# Patient Record
Sex: Female | Born: 1990 | Race: White | Hispanic: No | Marital: Married
Health system: Southern US, Community
[De-identification: ages and names within clinical notes are randomized; demographics above are authoritative.]

## PROBLEM LIST (undated history)

## (undated) DIAGNOSIS — Z832 Family history of diseases of the blood and blood-forming organs and certain disorders involving the immune mechanism: Secondary | ICD-10-CM

## (undated) DIAGNOSIS — F909 Attention-deficit hyperactivity disorder, unspecified type: Secondary | ICD-10-CM

## (undated) DIAGNOSIS — O24419 Gestational diabetes mellitus in pregnancy, unspecified control: Secondary | ICD-10-CM

## (undated) DIAGNOSIS — F411 Generalized anxiety disorder: Secondary | ICD-10-CM

## (undated) DIAGNOSIS — K219 Gastro-esophageal reflux disease without esophagitis: Secondary | ICD-10-CM

## (undated) HISTORY — DX: Generalized anxiety disorder: F41.1

## (undated) HISTORY — PX: WISDOM TOOTH EXTRACTION: SHX21

## (undated) HISTORY — DX: Gestational diabetes mellitus in pregnancy, unspecified control: O24.419

---
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
## 2009-09-02 ENCOUNTER — Emergency Department (HOSPITAL_COMMUNITY): Admission: EM | Admit: 2009-09-02 | Discharge: 2009-09-02 | Payer: Self-pay | Admitting: Emergency Medicine

## 2009-09-23 ENCOUNTER — Emergency Department (HOSPITAL_COMMUNITY): Admission: EM | Admit: 2009-09-23 | Discharge: 2009-09-23 | Payer: Self-pay | Admitting: Emergency Medicine

## 2010-06-09 LAB — URINALYSIS, ROUTINE W REFLEX MICROSCOPIC
Glucose, UA: NEGATIVE mg/dL
Hgb urine dipstick: NEGATIVE
Nitrite: NEGATIVE
Urobilinogen, UA: 0.2 mg/dL (ref 0.0–1.0)

## 2011-05-26 IMAGING — CR DG CHEST 2V
2 series · 2 of 2 positions shown · non-contrast
Comparison: None.

CLINICAL DATA: Difficulty breathing.  Trouble swallowing.

CHEST - 2 VIEW

[w chest pa *]
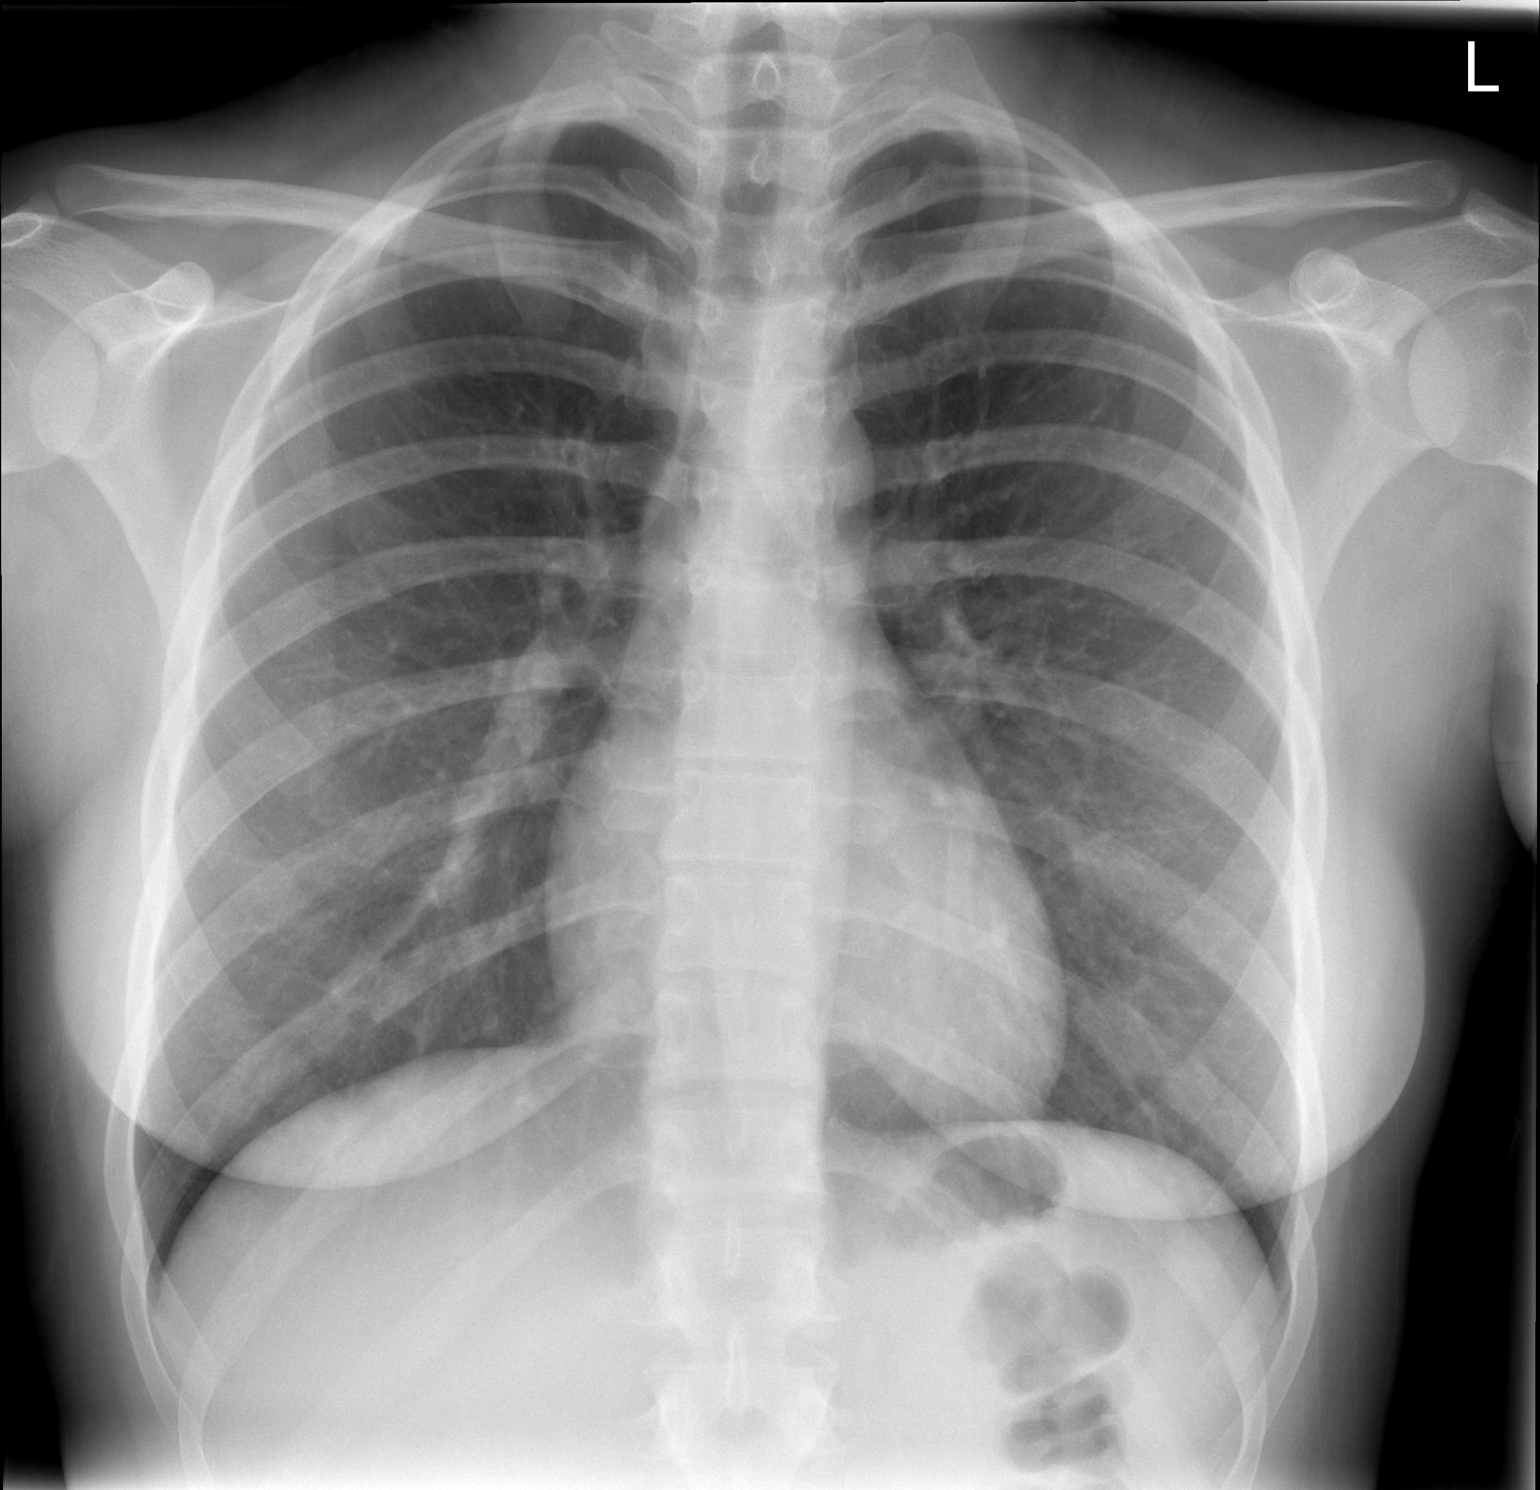

[w chest lat *]
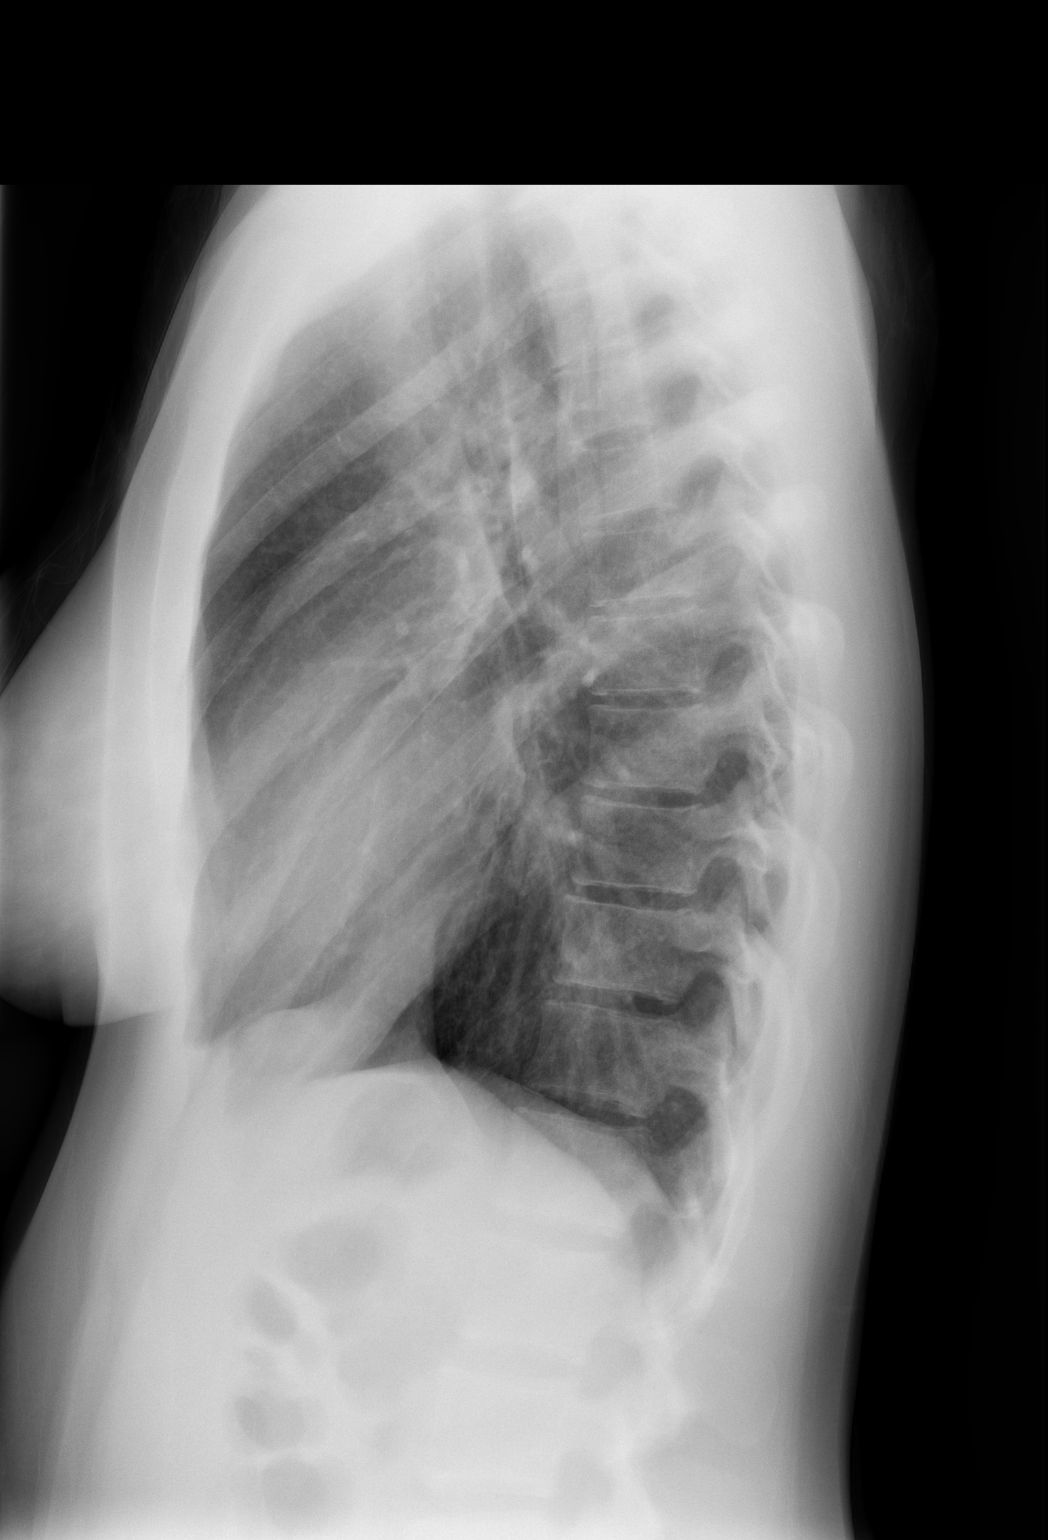

[2 of 2 positions shown; findings below may reference images not displayed]

FINDINGS: The heart size and pulmonary vascularity are normal and
the lungs are clear.  No bony abnormalities.
IMPRESSION: Normal chest.

## 2011-08-31 ENCOUNTER — Encounter (HOSPITAL_COMMUNITY): Payer: Self-pay | Admitting: Emergency Medicine

## 2011-08-31 ENCOUNTER — Emergency Department (HOSPITAL_COMMUNITY)
Admission: EM | Admit: 2011-08-31 | Discharge: 2011-08-31 | Disposition: A | Discharge status: Home / Self Care | Payer: BC Managed Care – PPO | Attending: Emergency Medicine | Admitting: Emergency Medicine

## 2011-08-31 DIAGNOSIS — R109 Unspecified abdominal pain: Secondary | ICD-10-CM | POA: Insufficient documentation

## 2011-08-31 LAB — CBC
HCT: 36 % (ref 36.0–46.0)
Hemoglobin: 12.6 g/dL (ref 12.0–15.0)
MCH: 30.9 pg (ref 26.0–34.0)
MCV: 88.2 fL (ref 78.0–100.0)
Platelets: 227 10*3/uL (ref 150–400)
RDW: 12.2 % (ref 11.5–15.5)
WBC: 10.7 10*3/uL — ABNORMAL HIGH (ref 4.0–10.5)

## 2011-08-31 LAB — COMPREHENSIVE METABOLIC PANEL
ALT: 11 U/L (ref 0–35)
Alkaline Phosphatase: 44 U/L (ref 39–117)
CO2: 19 mEq/L (ref 19–32)
Creatinine, Ser: 0.63 mg/dL (ref 0.50–1.10)
GFR calc Af Amer: >90 mL/min (ref >90)
Glucose, Bld: 81 mg/dL (ref 70–99)
Potassium: 3.6 mEq/L (ref 3.5–5.1)
Total Bilirubin: 0.3 mg/dL (ref 0.3–1.2)
Total Protein: 7 g/dL (ref 6.0–8.3)

## 2011-08-31 LAB — URINALYSIS, ROUTINE W REFLEX MICROSCOPIC
Bilirubin Urine: NEGATIVE
Glucose, UA: NEGATIVE mg/dL
Hgb urine dipstick: NEGATIVE
Ketones, ur: NEGATIVE mg/dL
Leukocytes, UA: NEGATIVE
Specific Gravity, Urine: 1.025 (ref 1.005–1.030)
Urobilinogen, UA: 1 mg/dL (ref 0.0–1.0)
pH: 7 (ref 5.0–8.0)

## 2011-08-31 LAB — DIFFERENTIAL
Basophils Relative: 0 % (ref 0–1)
Eosinophils Absolute: 0.1 10*3/uL (ref 0.0–0.7)
Eosinophils Relative: 1 % (ref 0–5)
Monocytes Absolute: 0.4 10*3/uL (ref 0.1–1.0)

## 2011-08-31 NOTE — Discharge Instructions (Signed)
As discussed please keep a diary of your discomfort, which you're doing Will you're doing at, what you've eaten how long the pain lasts how often it happens when was her last menstrual period in relation to your discomfort.  Also, try to establish yourself with a local physician for your insurance company in

## 2011-08-31 NOTE — ED Provider Notes (Addendum)
History     CSN: [id number]  Arrival date & time 08/31/11  1845   None     Chief Complaint  Patient presents with  . Abdominal Pain    (Consider location/radiation/quality/duration/timing/severity/associated sxs/prior treatment) HPI Comments: Patient states, that for the past 18 months.  She has had intermittent episodes of right upper quadrant stabbing pain.  These are infrequent and unpredictable she will suddenly have a stabbing sensation in her right upper quadrant.  That can last anywhere from one to 2 seconds to a half a minute.  They can be isolated incidences or come into store threes, today.  She was at work, and she had 3 or 4 episodes back-to-back.  That lasted a total of 30 minutes.  They are now resolved.  She has never had these in the past.  She denies nausea, vomiting, diarrhea, menstrual irregularities, urinary tract infections.  Gallbladder disease, pregnancy.  She has never taken any medication for these pains.  She cannot relate them to her menstrual cycle to food consumption to activity  Patient is a 21 y.o. female presenting with abdominal pain. The history is provided by the patient.  Abdominal Pain The primary symptoms of the illness include abdominal pain. The primary symptoms of the illness do not include fever, shortness of breath, nausea, vomiting, diarrhea, dysuria or vaginal bleeding.  Symptoms associated with the illness do not include chills, constipation or frequency.    History reviewed. No pertinent past medical history.  History reviewed. No pertinent past surgical history.  No family history on file.  History  Substance Use Topics  . Smoking status: Never Smoker   . Smokeless tobacco: Not on file  . Alcohol Use: Yes    OB History    Grav Para Term Preterm Abortions TAB SAB Ect Mult Living                  Review of Systems  Constitutional: Negative for fever and chills.  Respiratory: Negative for shortness of breath.   Gastrointestinal:  Positive for abdominal pain. Negative for nausea, vomiting, diarrhea and constipation.  Genitourinary: Negative for dysuria, frequency, flank pain and vaginal bleeding.    Allergies  Review of patient's allergies indicates no known allergies.  Home Medications   Current Outpatient Rx  Name Route Sig Dispense Refill  . NORGESTIM-ETH ESTRAD TRIPHASIC 0.18/0.215/0.25 MG-25 MCG PO TABS Oral Take 1 tablet by mouth daily.      BP 117/75  Pulse 81  Temp 98.1 F (36.7 C) (Oral)  Resp 18  SpO2 100%  LMP 08/10/2011  Physical Exam  Constitutional: She appears well-developed and well-nourished. She appears distressed.  Eyes: Pupils are equal, round, and reactive to light.  Neck: Normal range of motion.  Cardiovascular: Normal rate.   Pulmonary/Chest: Effort normal.  Abdominal: Soft. There is tenderness.  Neurological: She is alert.  Skin: Skin is warm and dry. No rash noted. No pallor.    ED Course  Procedures (including critical care time)  Labs Reviewed  CBC - Abnormal; Notable for the following:    WBC 10.7 (*)     All other components within normal limits  URINALYSIS, ROUTINE W REFLEX MICROSCOPIC  DIFFERENTIAL  COMPREHENSIVE METABOLIC PANEL  POCT PREGNANCY, URINE  LAB REPORT - SCANNED   No results found.   1. Abdominal pain       MDM   As lab values in urine, with patient that there is nothing specific, showing a history keep a diary of her pain,  which is doing, where she is, what she's eaten how long the pain lasts and 2 associates up with a local physician so they can kind of track of his discomfort        Arman Filter, NP 08/31/11 2202  Arman Filter, NP 09/05/11 1608  Arman Filter, NP 09/05/11 1609  Arman Filter, NP 09/10/11 1952  Arman Filter, NP 09/10/11 1952  Arman Filter, NP 11/22/11 2122

## 2011-08-31 NOTE — ED Notes (Addendum)
C/o intermittent stabbing RLQ pain since 5:45pm today.  Denies nausea, vomiting, and urinary complaints.  Denies pain at present.

## 2011-08-31 NOTE — ED Notes (Signed)
Discharged with instructions via teach back 

## 2011-09-10 NOTE — ED Provider Notes (Signed)
Medical screening examination/treatment/procedure(s) were performed by non-physician practitioner and as supervising physician I was immediately available for consultation/collaboration.   Carleene Cooper III, MD 09/10/11 [phone]

## 2011-11-23 NOTE — ED Provider Notes (Signed)
Medical screening examination/treatment/procedure(s) were performed by non-physician practitioner and as supervising physician I was immediately available for consultation/collaboration.   Carleene Cooper III, MD 11/23/11 1052

## 2013-08-20 ENCOUNTER — Emergency Department (HOSPITAL_COMMUNITY): Payer: Worker's Compensation

## 2013-08-20 ENCOUNTER — Encounter (HOSPITAL_COMMUNITY): Payer: Self-pay | Admitting: Emergency Medicine

## 2013-08-20 ENCOUNTER — Emergency Department (HOSPITAL_COMMUNITY)
Admission: EM | Admit: 2013-08-20 | Discharge: 2013-08-20 | Disposition: A | Discharge status: Home / Self Care | Payer: Worker's Compensation | Attending: Emergency Medicine | Admitting: Emergency Medicine

## 2013-08-20 DIAGNOSIS — Y9289 Other specified places as the place of occurrence of the external cause: Secondary | ICD-10-CM | POA: Insufficient documentation

## 2013-08-20 DIAGNOSIS — W108XXA Fall (on) (from) other stairs and steps, initial encounter: Secondary | ICD-10-CM | POA: Insufficient documentation

## 2013-08-20 DIAGNOSIS — W19XXXA Unspecified fall, initial encounter: Secondary | ICD-10-CM

## 2013-08-20 DIAGNOSIS — S300XXA Contusion of lower back and pelvis, initial encounter: Secondary | ICD-10-CM

## 2013-08-20 DIAGNOSIS — Y9301 Activity, walking, marching and hiking: Secondary | ICD-10-CM | POA: Insufficient documentation

## 2013-08-20 HISTORY — DX: Family history of diseases of the blood and blood-forming organs and certain disorders involving the immune mechanism: Z83.2

## 2013-08-20 MED ORDER — HYDROCODONE-ACETAMINOPHEN 5-325 MG PO TABS: 1.0000 | ORAL_TABLET | Freq: Four times a day (QID) | ORAL | Status: DC | PRN

## 2013-08-20 MED ORDER — NAPROXEN 500 MG PO TABS: 500.0000 mg | ORAL_TABLET | Freq: Two times a day (BID) | ORAL | Status: DC

## 2013-08-20 MED ORDER — HYDROCODONE-ACETAMINOPHEN 5-325 MG PO TABS
1.0000 | ORAL_TABLET | Freq: Once | ORAL | Status: AC
  Administered 2013-08-20: 1 via ORAL
  Filled 2013-08-20: qty 1

## 2013-08-20 NOTE — ED Provider Notes (Signed)
CSN: [id number]     Arrival date & time 08/20/13  1304 History   First MD Initiated Contact with Patient 08/20/13 1305     Chief Complaint  Patient presents with  . Fall     (Consider location/radiation/quality/duration/timing/severity/associated sxs/prior Treatment) HPI Monique [name] is a 23 y.o. female who presents to ed with complaint of a fall. Patient states she was at the pool and was walking down the steps, when she slipped, and fell on her bottom. She states that she then slid down 4 steps on her bottom. She denies hitting her head, denies any loss of consciousness. She denies hitting her back on anything. She states her pain is mainly in the pelvic area and in her tailbone. She states she's having pain moving her legs and standing. Denies any numbness or weakness in her legs. She denies any problems with her bladder or bowel control. She was given some ibuprofen when this happened to the pool. She's unsure if her symptoms have improved since. She denies any other medical problems.  Past Medical History  Diagnosis Date  . Family history of factor V Leiden mutation     pt has not been tested at this time   History reviewed. No pertinent past surgical history. History reviewed. No pertinent family history. History  Substance Use Topics  . Smoking status: Never Smoker   . Smokeless tobacco: Never Used  . Alcohol Use: Yes     Comment: occassionally   OB History   Grav Para Term Preterm Abortions TAB SAB Ect Mult Living                 Review of Systems  Constitutional: Negative for fever and chills.  Respiratory: Negative for cough, chest tightness and shortness of breath.   Cardiovascular: Negative for chest pain, palpitations and leg swelling.  Gastrointestinal: Negative for nausea, vomiting, abdominal pain and diarrhea.  Genitourinary: Negative for dysuria and flank pain.  Musculoskeletal: Positive for arthralgias and back pain. Negative for neck pain and neck  stiffness.  Skin: Negative for rash.  Neurological: Negative for dizziness, weakness and headaches.  All other systems reviewed and are negative.     Allergies  Review of patient's allergies indicates no known allergies.  Home Medications   Prior to Admission medications   Medication Sig Start Date End Date Taking? Authorizing Provider  Norgestimate-Ethinyl Estradiol Triphasic (ORTHO TRI-CYCLEN LO) 0.18/0.215/0.25 MG-25 MCG tablet Take 1 tablet by mouth daily.    Historical Provider, MD   BP 125/86  Pulse 80  Temp(Src) 97.3 F (36.3 C) (Oral)  Resp 18  SpO2 100%  LMP 08/14/2013  Physical Exam  Nursing note and vitals reviewed. Constitutional: She is oriented to person, place, and time. She appears well-developed and well-nourished. No distress.  HENT:  Head: Normocephalic.  Eyes: Conjunctivae are normal.  Neck: Neck supple.  Cardiovascular: Normal rate, regular rhythm and normal heart sounds.   Pulmonary/Chest: Effort normal and breath sounds normal. No respiratory distress. She has no wheezes. She has no rales.  Abdominal: Soft. Bowel sounds are normal. She exhibits no distension. There is no tenderness. There is no rebound.  Musculoskeletal: She exhibits no edema.  No thoracic or lumbar spine tenderness. Tender over midline sacrum and with palpation of bilateral pelvis. Coccyx tenderness. DP pulses intact.   Neurological: She is alert and oriented to person, place, and time.  5/5 and equal LE strength bilaterally. Sensation equal and intact bilaterally.   Skin: Skin is warm and dry.  Psychiatric: She has a normal mood and affect. Her behavior is normal.    ED Course  Procedures (including critical care time) Labs Review Labs Reviewed - No data to display  Imaging Review No results found.   EKG Interpretation None      MDM   Final diagnoses:  Contusion of coccyx  Fall  Contusion of sacrum    Patient after a fall down 4 steps. Landed on her butt talk.  No other injuries. Tender to palpation over pelvis and sacrum/coccyx area. No bruising or deformity noted. Neurovascularly intact. X-rays obtained and are negative. Patient ambulated up and down the hallway, she is having a lot of pain ambulating but is able to walk. Will discharge home with Naprosyn and Norco for severe pain. Follow with primary care Dr. if not improving.  Filed Vitals:   08/20/13 1304  BP: 125/86  Pulse: 80  Temp: 97.3 F (36.3 C)  TempSrc: Oral  Resp: 18  SpO2: 100%      Emerson Barretto A Christorpher Hisaw, PA-C 08/20/13 1440

## 2013-08-20 NOTE — ED Provider Notes (Signed)
Medical screening examination/treatment/procedure(s) were performed by non-physician practitioner and as supervising physician I was immediately available for consultation/collaboration.  Flint Melter, MD 08/20/13 [phone]

## 2013-08-20 NOTE — Discharge Instructions (Signed)
Your x-rays today are normal. Take naprosyn for pain and inflammation regularly. Take norco for severe pain only. Follow up with primary care doctor if not improving. Return if any numbness or weakness to extremities.    Tailbone Injury The tailbone (coccyx) is the small bone at the lower end of the spine. A tailbone injury may involve stretched ligaments, bruising, or a broken bone (fracture). Women are more vulnerable to this injury due to having a wider pelvis. CAUSES  This type of injury typically occurs from falling and landing on the tailbone. Repeated strain or friction from actions such as rowing and bicycling may also injure the area. The tailbone can be injured during childbirth. Infections or tumors may also press on the tailbone and cause pain. Sometimes, the cause of injury is unknown. SYMPTOMS   Bruising.  Pain when sitting.  Painful bowel movements.  In women, pain during intercourse. DIAGNOSIS  Your caregiver can diagnose a tailbone injury based on your symptoms and a physical exam. X-rays may be taken if a fracture is suspected. Your caregiver may also use an MRI scan imaging test to evaluate your symptoms. TREATMENT  Your caregiver may prescribe medicines to help relieve your pain. Most tailbone injuries heal on their own in 4 to 6 weeks. However, if the injury is caused by an infection or tumor, the recovery period may vary. PREVENTION  Wear appropriate padding and sports gear when bicycling and rowing. This can help prevent an injury from repeated strain or friction. HOME CARE INSTRUCTIONS   Put ice on the injured area.  Put ice in a plastic bag.  Place a towel between your skin and the bag.  Leave the ice on for 15-20 minutes, every hour while awake for the first 1 to 2 days.  Sit on a large, rubber or inflated ring or cushion to ease your pain. Lean forward when sitting to help decrease discomfort.  Avoid sitting for long periods of time.  Increase your  activity as the pain allows.  Only take over-the-counter or prescription medicines for pain, discomfort, or fever as directed by your caregiver.  You may use stool softeners if it is painful to have a bowel movement, or as directed by your caregiver.  Eat a diet with plenty of fiber to help prevent constipation.  Keep all follow-up appointments as directed by your caregiver. SEEK MEDICAL CARE IF:   Your pain becomes worse.  Your bowel movements cause a great deal of discomfort.  You are unable to have a bowel movement.  You have a fever. MAKE SURE YOU:  Understand these instructions.  Will watch your condition.  Will get help right away if you are not doing well or get worse. Document Released: 03/07/2000 Document Revised: 06/02/2011 Document Reviewed: 10/03/2010 [facility] Patient Information 2014 [place], [place].

## 2013-08-20 NOTE — ED Notes (Signed)
PTAR presents with a 23 yo female coming from work at [employer] with a fall.  Pt was walking around pool area and going down steps when she slipped on the moist/wet pavement and fell on sacral area and slid down 3 steps on sacral area.  Pt is alert and oriented x4 and did not suffer any LOC during incident.  Ice was applied to area on scene by PTAR and transported in prone position.  VS stable.  No medication hx./NKDA/Pt has family hx of Leiden Factor V.

## 2014-02-18 ENCOUNTER — Encounter (HOSPITAL_COMMUNITY): Payer: Self-pay | Admitting: Emergency Medicine

## 2014-02-18 ENCOUNTER — Emergency Department (HOSPITAL_COMMUNITY)
Admission: EM | Admit: 2014-02-18 | Discharge: 2014-02-18 | Disposition: A | Discharge status: Home / Self Care | Payer: BC Managed Care – PPO | Attending: Emergency Medicine | Admitting: Emergency Medicine

## 2014-02-18 DIAGNOSIS — J029 Acute pharyngitis, unspecified: Secondary | ICD-10-CM | POA: Diagnosis present

## 2014-02-18 DIAGNOSIS — H748X9 Other specified disorders of middle ear and mastoid, unspecified ear: Secondary | ICD-10-CM | POA: Insufficient documentation

## 2014-02-18 DIAGNOSIS — Z79899 Other long term (current) drug therapy: Secondary | ICD-10-CM | POA: Diagnosis not present

## 2014-02-18 DIAGNOSIS — H9201 Otalgia, right ear: Secondary | ICD-10-CM | POA: Diagnosis not present

## 2014-02-18 LAB — RAPID STREP SCREEN (MED CTR MEBANE ONLY): Streptococcus, Group A Screen (Direct): NEGATIVE

## 2014-02-18 MED ORDER — LIDOCAINE VISCOUS 2 % MT SOLN: 20.0000 mL | OROMUCOSAL | Status: DC | PRN

## 2014-02-18 MED ORDER — GUAIFENESIN 100 MG/5ML PO LIQD: 100.0000 mg | ORAL | Status: DC | PRN

## 2014-02-18 MED ORDER — LIDOCAINE VISCOUS 2 % MT SOLN
15.0000 mL | Freq: Once | OROMUCOSAL | Status: AC
  Administered 2014-02-18: 15 mL via OROMUCOSAL
  Filled 2014-02-18: qty 15

## 2014-02-18 MED ORDER — CETIRIZINE HCL 10 MG PO CAPS: 10.0000 mg | ORAL_CAPSULE | Freq: Every evening | ORAL | Status: DC | PRN

## 2014-02-18 NOTE — Discharge Instructions (Signed)
Pharyngitis °Pharyngitis is redness, pain, and swelling (inflammation) of your pharynx.  °CAUSES  °Pharyngitis is usually caused by infection. Most of the time, these infections are from viruses (viral) and are part of a cold. However, sometimes pharyngitis is caused by bacteria (bacterial). Pharyngitis can also be caused by allergies. Viral pharyngitis may be spread from person to person by coughing, sneezing, and personal items or utensils (cups, forks, spoons, toothbrushes). Bacterial pharyngitis may be spread from person to person by more intimate contact, such as kissing.  °SIGNS AND SYMPTOMS  °Symptoms of pharyngitis include:   °· Sore throat.   °· Tiredness (fatigue).   °· Low-grade fever.   °· Headache. °· Joint pain and muscle aches. °· Skin rashes. °· Swollen lymph nodes. °· Plaque-like film on throat or tonsils (often seen with bacterial pharyngitis). °DIAGNOSIS  °Your health care provider will ask you questions about your illness and your symptoms. Your medical history, along with a physical exam, is often all that is needed to diagnose pharyngitis. Sometimes, a rapid strep test is done. Other lab tests may also be done, depending on the suspected cause.  °TREATMENT  °Viral pharyngitis will usually get better in 3-4 days without the use of medicine. Bacterial pharyngitis is treated with medicines that kill germs (antibiotics).  °HOME CARE INSTRUCTIONS  °· Drink enough water and fluids to keep your urine clear or pale yellow.   °· Only take over-the-counter or prescription medicines as directed by your health care provider:   °¨ If you are prescribed antibiotics, make sure you finish them even if you start to feel better.   °¨ Do not take aspirin.   °· Get lots of rest.   °· Gargle with 8 oz of salt water (½ tsp of salt per 1 qt of water) as often as every 1-2 hours to soothe your throat.   °· Throat lozenges (if you are not at risk for choking) or sprays may be used to soothe your throat. °SEEK MEDICAL  CARE IF:  °· You have large, tender lumps in your neck. °· You have a rash. °· You cough up green, yellow-brown, or bloody spit. °SEEK IMMEDIATE MEDICAL CARE IF:  °· Your neck becomes stiff. °· You drool or are unable to swallow liquids. °· You vomit or are unable to keep medicines or liquids down. °· You have severe pain that does not go away with the use of recommended medicines. °· You have trouble breathing (not caused by a stuffy nose). °MAKE SURE YOU:  °· Understand these instructions. °· Will watch your condition. °· Will get help right away if you are not doing well or get worse. °Document Released: 03/10/2005 Document Revised: 12/29/2012 Document Reviewed: 11/15/2012 °ExitCare® Patient Information ©2015 [employer]. This information is not intended to replace advice given to you by your health care provider. Make sure you discuss any questions you have with your health care provider. ° ° °Emergency Department Resource Guide °1) Find a Doctor and Pay Out of Pocket °Although you won't have to find out who is covered by your insurance plan, it is a good idea to ask around and get recommendations. You will then need to call the office and see if the doctor you have chosen will accept you as a new patient and what types of options they offer for patients who are self-pay. Some doctors offer discounts or will set up payment plans for their patients who do not have insurance, but you will need to ask so you aren't surprised when you get to your appointment. ° °  2) Contact Your Local Health Department °Not all health departments have doctors that can see patients for sick visits, but many do, so it is worth a call to see if yours does. If you don't know where your local health department is, you can check in your phone book. The CDC also has a tool to help you locate your state's health department, and many state websites also have listings of all of their local health departments. ° °3) Find a Walk-in Clinic °If  your illness is not likely to be very severe or complicated, you may want to try a walk in clinic. These are popping up all over the country in pharmacies, drugstores, and shopping centers. They're usually staffed by nurse practitioners or physician assistants that have been trained to treat common illnesses and complaints. They're usually fairly quick and inexpensive. However, if you have serious medical issues or chronic medical problems, these are probably not your best option. ° °No Primary Care Doctor: °- Call [facility] at  832-8000 - they can help you locate a primary care doctor that  accepts your insurance, provides certain services, etc. °- Physician Referral Service- 1-800-533-3463 ° °Chronic Pain Problems: °Organization         Address  Phone   Notes  °[facility]  (336) 297-2271 Patients need to be referred by their primary care doctor.  ° °Medication Assistance: °Organization         Address  Phone   Notes  °[place] [facility] [address] °[place], [place] [address] (336) 641-8030 --Must be a resident of [place] °-- Must have NO insurance coverage whatsoever (no Medicaid/ Medicare, etc.) °-- The pt. MUST have a primary care doctor that directs their care regularly and follows them in the community °  °[facility]  (866) 331-1348   °[employer]  (888) 892-1162   ° °Agencies that provide inexpensive medical care: °Organization         Address  Phone   Notes  °[facility]  (336) 832-8035   °[facility]    (336) 832-7272   °[facility] [address] °[place], [place] [address] (336) 832-4777   °[facility] [address], °[place] (336) 271-4999   °[facility]    (336) 373-0678   °[facility]    (336) 272-1050   °[facility] ° [address], [place] Phone:  (336) 832-4444, Fax:  (336) 832-4440 Hours of  Operation:  9 am - 6 pm, M-F.  Also accepts Medicaid/Medicare and self-pay.  °[facility] ° [address], [place] Phone: (336) 832-3150, Fax: (336) [phone]. Hours of Operation:  8:30 am - 5:30 pm, M-F.  Also accepts Medicaid and self-pay.  °[facility] [place] [address], [place] Phone: (336) 878-6027   °[facility] [address], [place], [place] (336)723-1848, Ext. 123 Mondays & Thursdays: 7-9 AM.  First 15 patients are seen on a first come, first serve basis. °  ° °Medicaid-accepting [place] Providers: ° °Organization         Address  Phone   Notes  °[facility] [address], [place] (336) 641-2100 Also accepts self-pay patients.  °[facility] [address], [place] ° (336) 856-9996   °[facility] [address], [place] (336) 288-8857   °[facility] [address] [place] [address], [place] (336) 299-7000   °Veita Bland [address], [place]  ° (336) 373-1557 Only accepts [place] Access Medicaid patients after they have their name applied to their card.  ° °Self-Pay (no insurance) in [place]: ° °Organization         Address  Phone   Notes  °Sickle Cell Patients, [facility] [address], [place] (336) 832-1970   °[facility] [address], [place] (336) 832-4400   °[facility] ° [address], [place] (336) 992-4800   °[facility]/Dr. Osei-Bonsu ° [address] [place] [address], [place] or [address], [place] (336) 841-8500 Phone number for both [place] and [place] locations is the same.  °[facility] [address], [place] (336) 299-0000   °[facility] [address] [place] [address], [place] or [address] (336) 852-7530 °(336) 878-2260   °[facility] [address], [place] (336) 350-1642, phone; (336) 294-5005, fax Sees patients 1st and 3rd Saturday of every month.  Must not qualify for public or private insurance (i.e. Medicaid, Medicare, [place] Health Choice, Veterans' Benefits) • Household income should be no more than 200% of the poverty level •The clinic cannot treat you if you are pregnant or think you are pregnant • Sexually transmitted diseases are not treated at the clinic.  ° ° °Dental Care: °Organization         Address  Phone  Notes  °[facility] [address], [place] (336) 641-6152 Accepts children up to age 21 who are enrolled in Medicaid or [place] Health Choice; pregnant women with a Medicaid card; and children who have applied for Medicaid or [place] Health Choice, but were declined, whose parents can pay a reduced fee at time of service.  °[facility] [place]  [address], [place] (336) 641-7733 Accepts children up to age 21 who are enrolled in Medicaid or [place] Health Choice; pregnant women with a Medicaid card; and children who have applied for Medicaid or [place] Health Choice, but were declined, whose parents can pay a reduced fee at time of service.  °[facility] ° [address], [place] (336) 641-4533 Patients are seen by appointment only. Walk-ins are not accepted. [facility] will see patients 18 years of age and older. °Monday - Tuesday (8am-5pm) °Most Wednesdays (8:30-5pm) °$30 per visit, cash only  °[facility] ° [address], [place] (336) 641-4533 Patients are seen by appointment only. Walk-ins are not accepted. [facility] will see patients 18 years of age and older. °One Wednesday Evening (Monthly: Volunteer Based).  $30 per visit, cash only  °[facility]  (919) 537-3737 for adults; Children under age 4, call Graduate Pediatric  Dentistry at ([phone]) [phone]. Children aged 4-14, please call (919) [phone] to request a pediatric application. ° Dental services are provided in all areas of dental care including fillings, crowns and bridges, complete and partial dentures, implants, gum treatment, root canals, and extractions. Preventive care is also provided. Treatment is provided to both adults and children. °Patients are selected via a lottery and there is often a waiting list. °  °[facility] [address], °  [place] ° (336) 763-8833 [email or web address] °  °[facility] [address], [place], [place] (336)723-1848, Ext. 123 Second and Fourth Thursday of each month, opens at 6:30 AM; Clinic ends at 9 AM.  Patients are seen on a first-come first-served basis, and a limited number are seen during each clinic.  ° °[facility] ° [address], [place], [place] (336) 723-7904   Eligibility Requirements °You must have lived in [place], [place], or [place] counties for at least the last three months. °  You cannot be eligible for state or federal sponsored healthcare insurance, including Veterans Administration, Medicaid, or Medicare. °  You generally cannot be eligible for healthcare insurance through your employer.  °  How to apply: °Eligibility screenings are held every Tuesday and Wednesday afternoon from 1:00 pm until 4:00 pm. You do not need an appointment for the interview!  °[facility] [address], [place], [place] 336-631-2330   °[facility]  336-342-8273   °[facility]  336-703-3100   °[facility]  336-570-6415   ° °Behavioral Health Resources in the Community: °Intensive Outpatient Programs °Organization         Address  Phone  Notes  °[facility] [address], [place], [place] 336-878-6098   °[facility] [address], [place], [place] 336-832-9800   °[facility] [address], [place], [place] ° 336-882-2125   °[facility] [address],  °[place], [place] 1-800-853-5163 or 336-641-4981   °Substance Abuse Resources °Organization         Address  Phone  Notes  °[facility]  336-882-2125   °[facility]  336-784-9470   °[facility]  336-285-9073   °[facility]  336-845-3988   °Residential & Outpatient Substance Abuse Program  1-800-659-3381   °Psychological Services °Organization         Address  Phone  Notes  °[facility]  336- 832-9600   °[facility]  336- 378-7881   °[facility] [address], [place] 1-800-853-5163 or 336-641-4981   ° °Mobile Crisis Teams °Organization         Address  Phone  Notes  °[facility], Mobile Crisis Care Unit  1-877-626-1772   °Assertive °[facility] ° [address] [place], [place] 336-834-9664   °Sharon DeEsch [address] °[place] 336-554-5454   ° °Self-Help/Support Groups °Organization         Address  Phone             Notes  °[facility] - variety of support groups  336- 373-1402 Call for more information  °Narcotics Anonymous (NA), [facility] [address], °[place]  2 meetings at this location  ° °Residential Treatment Programs °Organization         Address  Phone  Notes  °[facility] [address],    °[place]  1-866-801-8205   °[facility] ° [address], [place], [place] 704-293-8524   °[facility] [address], [place] 336-845-3988 Admissions: 8am-3pm M-F  °[facility] [address]    °[place], [place] 336-841-1104   °[facility] [address], [place], [place] 336-379-7146   °[facility] [address]  °[place],  336-285-9073   °[facility] - Intensive Outpatient [address],  [place], [place] 336-852-3033   °[facility] [address]  °[place], [place] 1-877-615-2722 or 336-784-9470   °[facility] [address] [place], [place] 336-227-7417 Accepts Medicaid  °[facility] [address]  °[place] 1-800-659-3381 Substance Abuse/Addiction Treatment  ° °[place] Behavioral Health Resources °Organization         Address  Phone  Notes  °[facility]  (888) 581-9988   °Julie Brannon, PhD [address] [place], [place]   (336) 349-5553 or (336) 951-0000   °[facility]   [address] °[place], [place] (336) 349-4454   °[facility] [address], [place], [place] (336) 342-8316 Insurance/Medicaid/sponsorship through [facility]  °[facility] [address]                                    [place], [place] (336) 342-8316 Therapy/tele-psych/case  °[facility] [address]  ° [place], [place] (336) 349-2233    °Dr. Arfeen  (336) 349-4544   °[facility] [place]  [employer] [place] [facility] 1) [address], [place] °2) [address], [place] °3)  [address], [place] (336) 349-3220 °(336) 342-7768 ° °(336) 342-8140   °[place] [facility] (336) 342-1394 or (336) 342-3537 (After Hours)    ° ° ° ° °

## 2014-02-18 NOTE — ED Provider Notes (Signed)
CSN: [id number]     Arrival date & time 02/18/14  0630 History   First MD Initiated Contact with Patient 02/18/14 0636     Chief Complaint  Patient presents with  . Otalgia  . Sore Throat  . Lymphadenopathy   HPI  Patient is a 23 year old female who presents to the emergency room for sore throat, right ear pain, and dry cough 5 days. Patient states that originally her symptoms started off as a cold. Her cough was dry. She also endorses congestion, runny nose, and mild ear pain. Last night she states that she felt like she could hear nothing out of her right ear it seemed clogged and had an echo to it. She states that she is still having those symptoms. She also states that her throat felt like it got worse. She has been trying NyQuil, DayQuil, cough syrups, cough drops, and ibuprofen with little relief. She states that she has been able to eat and drink okay, but has poor appetite. Patient denies any sick contacts. Nothing makes her feel worse.  Past Medical History  Diagnosis Date  . Family history of factor V Leiden mutation     pt has not been tested at this time   History reviewed. No pertinent past surgical history. History reviewed. No pertinent family history. History  Substance Use Topics  . Smoking status: Never Smoker   . Smokeless tobacco: Never Used  . Alcohol Use: Yes     Comment: occassionally   OB History    No data available     Review of Systems  Constitutional: Positive for fatigue. Negative for fever and chills.  HENT: Positive for congestion, ear pain, hearing loss, postnasal drip, rhinorrhea and sore throat. Negative for ear discharge, facial swelling, mouth sores, nosebleeds, sinus pressure, sneezing, tinnitus, trouble swallowing and voice change.   Respiratory: Positive for cough. Negative for chest tightness, shortness of breath, wheezing and stridor.   Gastrointestinal: Negative for nausea and vomiting.  All other systems reviewed and are  negative.     Allergies  Review of patient's allergies indicates no known allergies.  Home Medications   Prior to Admission medications   Medication Sig Start Date End Date Taking? Authorizing Provider  BIOTIN PO Take 1 tablet by mouth daily.   Yes Historical Provider, MD  CRANBERRY PO Take 1 tablet by mouth daily.   Yes Historical Provider, MD  HYDROcodone-acetaminophen (NORCO) 5-325 MG per tablet Take 1 tablet by mouth every 6 (six) hours as needed for moderate pain. 08/20/13  Yes Tatyana A Kirichenko, PA-C  ibuprofen (ADVIL,MOTRIN) 200 MG tablet Take 800 mg by mouth every 8 (eight) hours as needed for mild pain.   Yes Historical Provider, MD  Multiple Vitamins-Calcium (ONE-A-DAY WOMENS PO) Take 1 tablet by mouth daily.   Yes Historical Provider, MD  Norgestimate-Ethinyl Estradiol Triphasic (ORTHO TRI-CYCLEN LO) 0.18/0.215/0.25 MG-25 MCG tablet Take 1 tablet by mouth daily.   Yes Historical Provider, MD  naproxen (NAPROSYN) 500 MG tablet Take 1 tablet (500 mg total) by mouth 2 (two) times daily. Patient not taking: Reported on 02/18/2014 08/20/13   Tatyana A Kirichenko, PA-C   BP 114/76 mmHg  Pulse 77  Temp(Src) 97.8 F (36.6 C) (Oral)  Resp 18  Ht 5\' 4"  (1.626 m)  Wt 150 lb (68.04 kg)  BMI 25.73 kg/m2  SpO2 99%  LMP 01/29/2014 (Exact Date) Physical Exam  Constitutional: She is oriented to person, place, and time. She appears well-developed and well-nourished. No distress.  HENT:  Head: Normocephalic and atraumatic.  Nose: Mucosal edema present.  Mouth/Throat: Uvula is midline and oropharynx is clear and moist. No trismus in the jaw. No oropharyngeal exudate, posterior oropharyngeal edema, posterior oropharyngeal erythema or tonsillar abscesses.  Bilateral TMs pearly gray, visible cone of light, no erythema. There is mild middle ear effusion.  Eyes: Conjunctivae and EOM are normal. Pupils are equal, round, and reactive to light. No scleral icterus.  Neck: Normal range of  motion. Neck supple. No JVD present. No thyromegaly present.  Cardiovascular: Normal rate, regular rhythm, normal heart sounds and intact distal pulses.  Exam reveals no gallop and no friction rub.   No murmur heard. Pulmonary/Chest: Effort normal and breath sounds normal. No respiratory distress. She has no wheezes. She has no rales. She exhibits no tenderness.  Musculoskeletal: Normal range of motion.  Lymphadenopathy:    She has no cervical adenopathy.  Neurological: She is alert and oriented to person, place, and time.  Skin: Skin is warm and dry. She is not diaphoretic.  Psychiatric: She has a normal mood and affect. Her behavior is normal. Judgment and thought content normal.  Nursing note and vitals reviewed.   ED Course  Procedures (including critical care time) Labs Review Labs Reviewed  RAPID STREP SCREEN  CULTURE, GROUP A STREP    Imaging Review No results found.   EKG Interpretation None      MDM   Final diagnoses:  Viral pharyngitis   Patient is a 23 year old female who presents emergency room for sore throat, ear pain, and dry cough. Vital signs are stable. Physical exam reveals alert nontoxic-appearing female with midline uvula, no evidence of ear infections, no evidence of peritonsillar abscesses. Rapid strep screen is negative. Suspect that this is viral pharyngitis. Will treat symptomatically with viscous lidocaine, antihistamines, short course of Afrin, and Robitussin. Patient to return for trismus, drooling, difficulty swallowing, or any other concerning symptoms. Patient states understanding and agreement at this time. Patient is stable for discharge.  Patient discussed with Dr. Micheline Mazeocherty who agrees with the above treatment and plan.  Eben Burowourtney A Forcucci, PA-C 02/18/14 [id number]  Toy CookeyMegan Docherty, MD 02/20/14 2136

## 2014-02-18 NOTE — ED Notes (Signed)
Patient here with complaint of right ear, throat, and neck pain. States that she has been having trouble with cold like symptoms throughout this week which improved until last night. States that last night her ear began to hurt and she lost hearing suddenly. Afterward her throat and neck began to feel sore. Denies fevers at home, endorses cough but non-productive.

## 2014-02-20 LAB — CULTURE, GROUP A STREP

## 2015-05-13 IMAGING — CR DG SACRUM/COCCYX 2+V
3 series · 3 of 3 positions shown · non-contrast
Comparison: None.

CLINICAL DATA: Sacral pain following injury

EXAM:
SACRUM AND COCCYX - 2+ VIEW

[t sacrum a.p.]
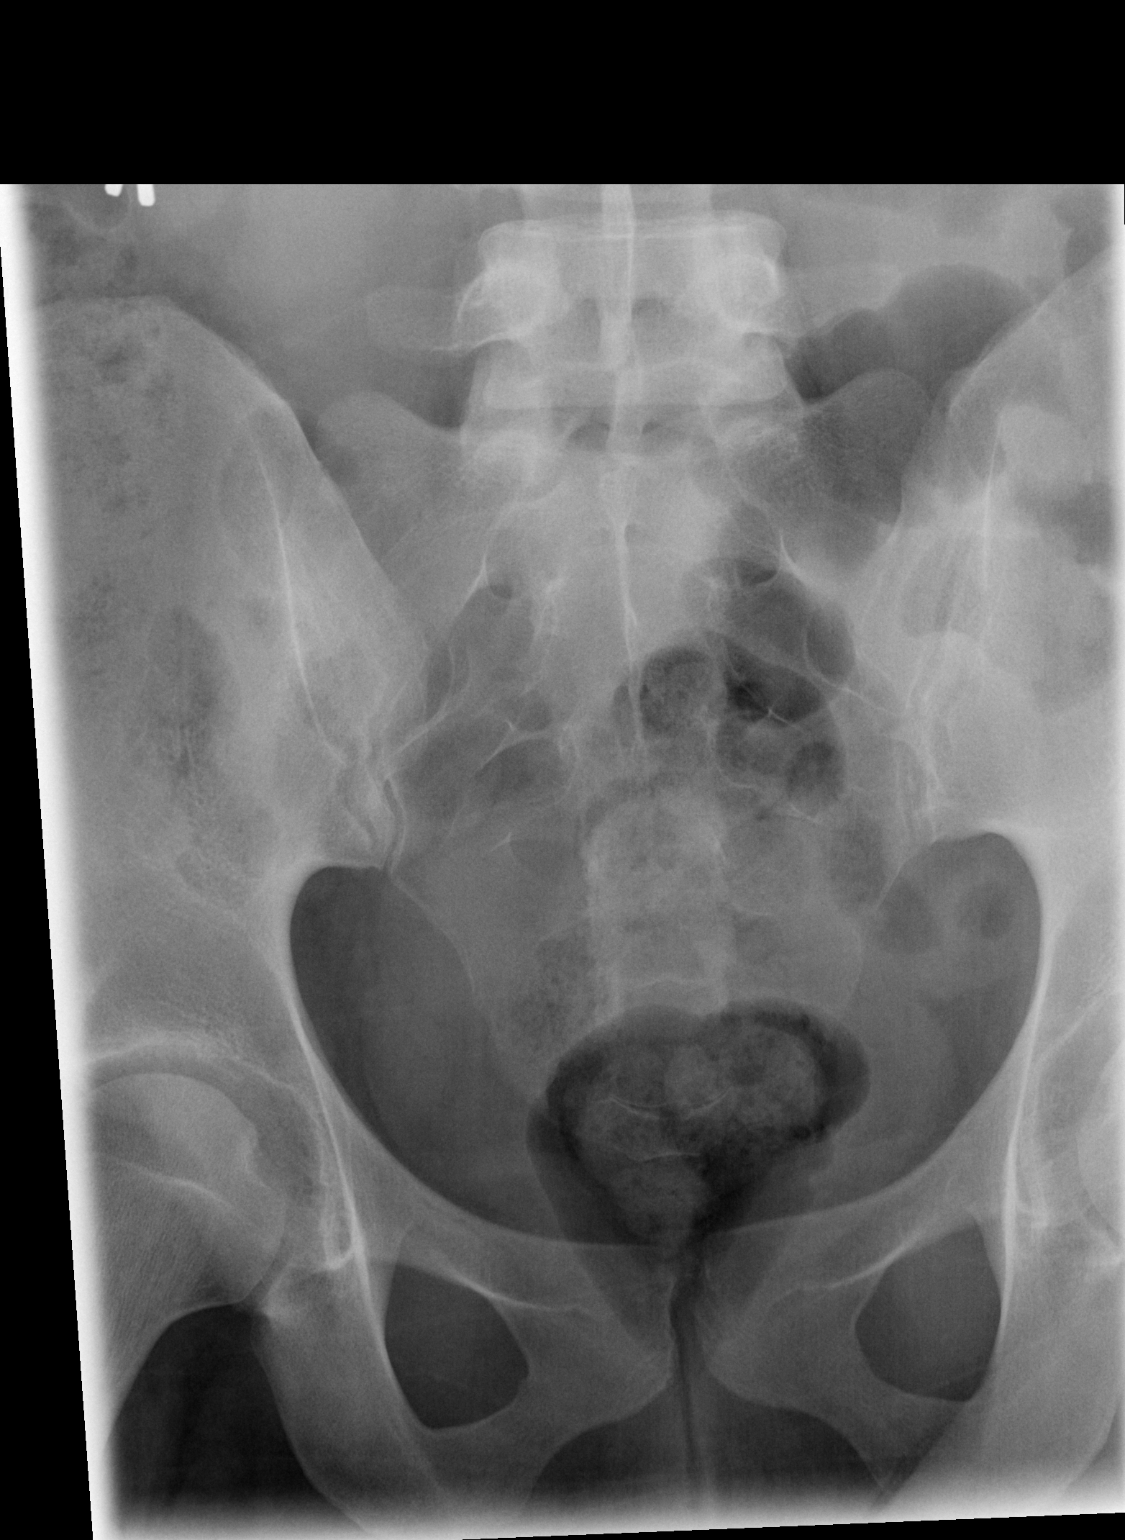

[t coccyx a.p.]
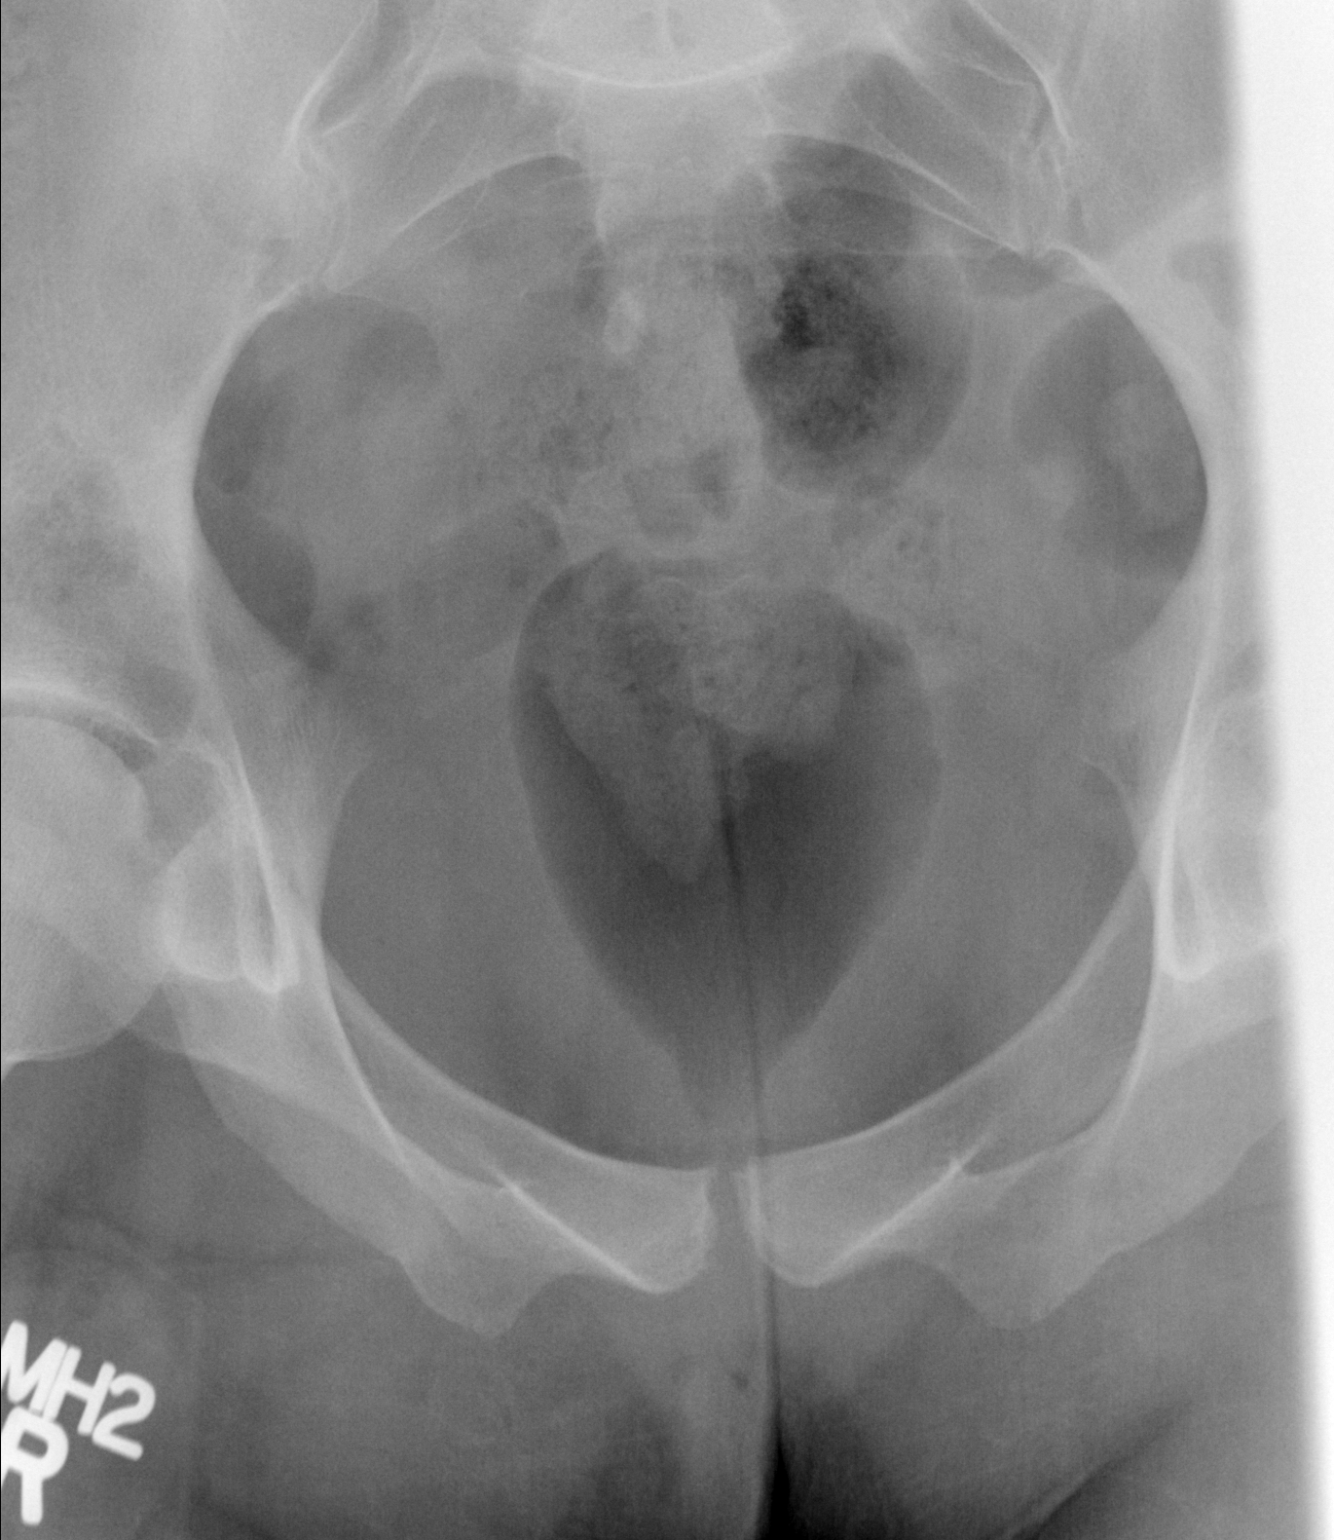

[t sacrum lat]
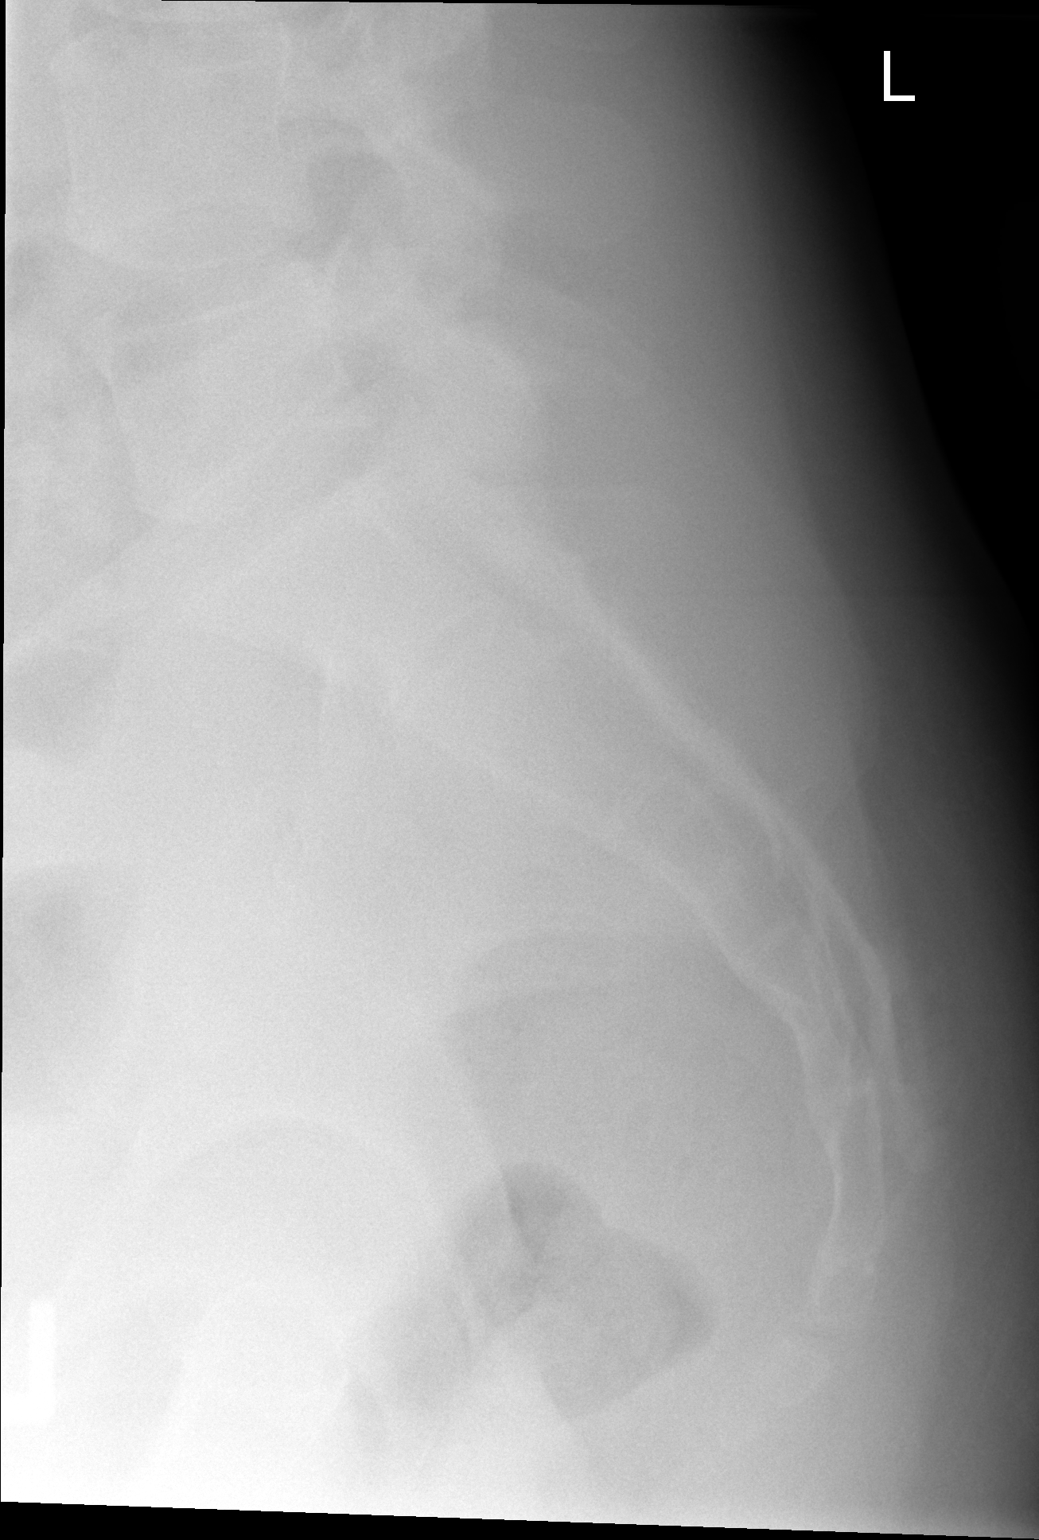

[3 of 3 positions shown; findings below may reference images not displayed]

FINDINGS: The pelvic ring is intact. No acute fracture is noted. No soft
tissue changes are seen.
IMPRESSION: No acute abnormality noted.

## 2017-02-11 DIAGNOSIS — Z23 Encounter for immunization: Secondary | ICD-10-CM | POA: Diagnosis not present

## 2017-06-05 DIAGNOSIS — W57XXXA Bitten or stung by nonvenomous insect and other nonvenomous arthropods, initial encounter: Secondary | ICD-10-CM | POA: Diagnosis not present

## 2017-12-31 DIAGNOSIS — Z23 Encounter for immunization: Secondary | ICD-10-CM | POA: Diagnosis not present

## 2018-04-02 DIAGNOSIS — E663 Overweight: Secondary | ICD-10-CM | POA: Diagnosis not present

## 2018-04-02 DIAGNOSIS — Z6827 Body mass index (BMI) 27.0-27.9, adult: Secondary | ICD-10-CM | POA: Diagnosis not present

## 2018-11-26 DIAGNOSIS — F909 Attention-deficit hyperactivity disorder, unspecified type: Secondary | ICD-10-CM | POA: Diagnosis not present

## 2018-11-26 DIAGNOSIS — Z6828 Body mass index (BMI) 28.0-28.9, adult: Secondary | ICD-10-CM | POA: Diagnosis not present

## 2018-11-26 DIAGNOSIS — T7840XA Allergy, unspecified, initial encounter: Secondary | ICD-10-CM | POA: Diagnosis not present

## 2018-12-24 DIAGNOSIS — H5203 Hypermetropia, bilateral: Secondary | ICD-10-CM | POA: Diagnosis not present

## 2018-12-24 DIAGNOSIS — H5210 Myopia, unspecified eye: Secondary | ICD-10-CM | POA: Diagnosis not present

## 2019-01-17 DIAGNOSIS — L500 Allergic urticaria: Secondary | ICD-10-CM | POA: Diagnosis not present

## 2019-02-04 DIAGNOSIS — Z3490 Encounter for supervision of normal pregnancy, unspecified, unspecified trimester: Secondary | ICD-10-CM | POA: Diagnosis not present

## 2019-02-09 DIAGNOSIS — Z3201 Encounter for pregnancy test, result positive: Secondary | ICD-10-CM | POA: Diagnosis not present

## 2019-02-24 DIAGNOSIS — Z3201 Encounter for pregnancy test, result positive: Secondary | ICD-10-CM | POA: Diagnosis not present

## 2019-03-08 DIAGNOSIS — Z3401 Encounter for supervision of normal first pregnancy, first trimester: Secondary | ICD-10-CM | POA: Diagnosis not present

## 2019-03-08 DIAGNOSIS — F419 Anxiety disorder, unspecified: Secondary | ICD-10-CM | POA: Diagnosis not present

## 2019-03-08 DIAGNOSIS — Z3689 Encounter for other specified antenatal screening: Secondary | ICD-10-CM | POA: Diagnosis not present

## 2019-03-16 DIAGNOSIS — Z124 Encounter for screening for malignant neoplasm of cervix: Secondary | ICD-10-CM | POA: Diagnosis not present

## 2019-03-16 DIAGNOSIS — Z3A10 10 weeks gestation of pregnancy: Secondary | ICD-10-CM | POA: Diagnosis not present

## 2019-03-16 DIAGNOSIS — O021 Missed abortion: Secondary | ICD-10-CM | POA: Diagnosis not present

## 2019-03-16 DIAGNOSIS — Z118 Encounter for screening for other infectious and parasitic diseases: Secondary | ICD-10-CM | POA: Diagnosis not present

## 2019-04-01 DIAGNOSIS — O0289 Other abnormal products of conception: Secondary | ICD-10-CM | POA: Diagnosis not present

## 2019-04-05 ENCOUNTER — Other Ambulatory Visit (HOSPITAL_COMMUNITY): Payer: Self-pay | Admitting: Obstetrics and Gynecology

## 2019-04-05 ENCOUNTER — Other Ambulatory Visit: Payer: Self-pay | Admitting: Obstetrics and Gynecology

## 2019-04-05 ENCOUNTER — Other Ambulatory Visit (HOSPITAL_COMMUNITY)
Admission: RE | Admit: 2019-04-05 | Discharge: 2019-04-05 | Disposition: A | Discharge status: Home / Self Care | Payer: BC Managed Care – PPO | Source: Ambulatory Visit | Attending: Obstetrics and Gynecology | Admitting: Obstetrics and Gynecology

## 2019-04-05 DIAGNOSIS — Z20822 Contact with and (suspected) exposure to covid-19: Secondary | ICD-10-CM | POA: Diagnosis not present

## 2019-04-05 DIAGNOSIS — Z01812 Encounter for preprocedural laboratory examination: Secondary | ICD-10-CM | POA: Diagnosis not present

## 2019-04-05 DIAGNOSIS — O034 Incomplete spontaneous abortion without complication: Secondary | ICD-10-CM

## 2019-04-05 DIAGNOSIS — Z419 Encounter for procedure for purposes other than remedying health state, unspecified: Secondary | ICD-10-CM

## 2019-04-06 ENCOUNTER — Other Ambulatory Visit: Payer: Self-pay

## 2019-04-06 ENCOUNTER — Encounter (HOSPITAL_BASED_OUTPATIENT_CLINIC_OR_DEPARTMENT_OTHER): Payer: Self-pay | Admitting: Obstetrics and Gynecology

## 2019-04-06 NOTE — Progress Notes (Signed)
Spoke w/ via phone for pre-op interview---Monique [name] needs dos----   cbc           [name] results------ COVID test ------04-05-2019 Arrive at -------530 am 04-08-2019 NPO after ------midnight Medications to take morning of surgery -----loradataine, xanax prn Diabetic medication -----n/a Patient Special Instructions ----- Pre-Op special Istructions ----- Patient verbalized understanding of instructions that were given at this phone interview. Patient denies shortness of breath, chest pain, fever, cough a this phone interview.

## 2019-04-07 LAB — NOVEL CORONAVIRUS, NAA (HOSP ORDER, SEND-OUT TO REF LAB; TAT 18-24 HRS): SARS-CoV-2, NAA: NOT DETECTED

## 2019-04-07 NOTE — Anesthesia Preprocedure Evaluation (Addendum)
Anesthesia Evaluation  Patient identified by MRN, date of birth, ID band Patient awake    Reviewed: Allergy & Precautions, NPO status , Patient's Chart, lab work & pertinent test results  Airway Mallampati: I  TM Distance: >3 FB Neck ROM: Full    Dental no notable dental hx.    Pulmonary neg pulmonary ROS,    Pulmonary exam normal breath sounds clear to auscultation       Cardiovascular negative cardio ROS Normal cardiovascular exam Rhythm:Regular Rate:Normal     Neuro/Psych PSYCHIATRIC DISORDERS negative neurological ROS     GI/Hepatic negative GI ROS, Neg liver ROS,   Endo/Other  negative endocrine ROS  Renal/GU negative Renal ROS     Musculoskeletal negative musculoskeletal ROS (+)   Abdominal   Peds  Hematology Family history of factor V Leiden mutation   Anesthesia Other Findings Retained Products of Conception  Reproductive/Obstetrics                            Anesthesia Physical Anesthesia Plan  ASA: II  Anesthesia Plan: MAC   Post-op Pain Management:    Induction: Intravenous  PONV Risk Score and Plan: 2 and Ondansetron, Midazolam, Treatment may vary due to age or medical condition and Propofol infusion  Airway Management Planned: Simple Face Mask  Additional Equipment:   Intra-op Plan:   Post-operative Plan:   Informed Consent: I have reviewed the patients History and Physical, chart, labs and discussed the procedure including the risks, benefits and alternatives for the proposed anesthesia with the patient or authorized representative who has indicated his/her understanding and acceptance.     Dental advisory given  Plan Discussed with: CRNA  Anesthesia Plan Comments:         Anesthesia Quick Evaluation

## 2019-04-08 ENCOUNTER — Encounter (HOSPITAL_BASED_OUTPATIENT_CLINIC_OR_DEPARTMENT_OTHER): Payer: Self-pay | Admitting: Obstetrics and Gynecology

## 2019-04-08 ENCOUNTER — Ambulatory Visit (HOSPITAL_COMMUNITY)
Admission: RE | Admit: 2019-04-08 | Discharge: 2019-04-08 | Disposition: A | Discharge status: Home / Self Care | Payer: BC Managed Care – PPO | Source: Ambulatory Visit | Attending: Obstetrics and Gynecology | Admitting: Obstetrics and Gynecology

## 2019-04-08 ENCOUNTER — Ambulatory Visit (HOSPITAL_BASED_OUTPATIENT_CLINIC_OR_DEPARTMENT_OTHER): Payer: BC Managed Care – PPO | Admitting: Anesthesiology

## 2019-04-08 ENCOUNTER — Ambulatory Visit (HOSPITAL_BASED_OUTPATIENT_CLINIC_OR_DEPARTMENT_OTHER)
Admission: RE | Admit: 2019-04-08 | Discharge: 2019-04-08 | Disposition: A | Discharge status: Home / Self Care | Payer: BC Managed Care – PPO | Attending: Obstetrics and Gynecology | Admitting: Obstetrics and Gynecology

## 2019-04-08 ENCOUNTER — Encounter (HOSPITAL_BASED_OUTPATIENT_CLINIC_OR_DEPARTMENT_OTHER)
Admission: RE | Disposition: A | Discharge status: Home / Self Care | Payer: Self-pay | Source: Home / Self Care | Attending: Obstetrics and Gynecology

## 2019-04-08 ENCOUNTER — Other Ambulatory Visit: Payer: Self-pay

## 2019-04-08 DIAGNOSIS — Z8489 Family history of other specified conditions: Secondary | ICD-10-CM | POA: Insufficient documentation

## 2019-04-08 DIAGNOSIS — F909 Attention-deficit hyperactivity disorder, unspecified type: Secondary | ICD-10-CM | POA: Insufficient documentation

## 2019-04-08 DIAGNOSIS — O034 Incomplete spontaneous abortion without complication: Secondary | ICD-10-CM | POA: Diagnosis not present

## 2019-04-08 DIAGNOSIS — Z791 Long term (current) use of non-steroidal anti-inflammatories (NSAID): Secondary | ICD-10-CM | POA: Insufficient documentation

## 2019-04-08 DIAGNOSIS — Z419 Encounter for procedure for purposes other than remedying health state, unspecified: Secondary | ICD-10-CM

## 2019-04-08 DIAGNOSIS — K219 Gastro-esophageal reflux disease without esophagitis: Secondary | ICD-10-CM | POA: Diagnosis not present

## 2019-04-08 DIAGNOSIS — Z79899 Other long term (current) drug therapy: Secondary | ICD-10-CM | POA: Diagnosis not present

## 2019-04-08 HISTORY — PX: DILATION AND EVACUATION: SHX1459

## 2019-04-08 HISTORY — PX: OPERATIVE ULTRASOUND: SHX5996

## 2019-04-08 HISTORY — DX: Attention-deficit hyperactivity disorder, unspecified type: F90.9

## 2019-04-08 HISTORY — DX: Gastro-esophageal reflux disease without esophagitis: K21.9

## 2019-04-08 LAB — CBC
HCT: 39.9 % (ref 36.0–46.0)
Hemoglobin: 13.5 g/dL (ref 12.0–15.0)
MCH: 31.3 pg (ref 26.0–34.0)
MCHC: 33.8 g/dL (ref 30.0–36.0)
MCV: 92.4 fL (ref 80.0–100.0)
Platelets: 280 10*3/uL (ref 150–400)
RBC: 4.32 MIL/uL (ref 3.87–5.11)
RDW: 12 % (ref 11.5–15.5)
WBC: 7.3 10*3/uL (ref 4.0–10.5)
nRBC: 0 % (ref 0.0–0.2)

## 2019-04-08 SURGERY — DILATION AND EVACUATION, UTERUS
Anesthesia: Monitor Anesthesia Care | Site: Vagina

## 2019-04-08 MED ORDER — KETOROLAC TROMETHAMINE 30 MG/ML IJ SOLN
30.0000 mg | Freq: Once | INTRAMUSCULAR | Status: DC | PRN
  Filled 2019-04-08: qty 1

## 2019-04-08 MED ORDER — FENTANYL CITRATE (PF) 250 MCG/5ML IJ SOLN
INTRAMUSCULAR | Status: DC | PRN
  Administered 2019-04-08: 25 ug via INTRAVENOUS
  Administered 2019-04-08: 50 ug via INTRAVENOUS
  Administered 2019-04-08: 25 ug via INTRAVENOUS

## 2019-04-08 MED ORDER — FENTANYL CITRATE (PF) 100 MCG/2ML IJ SOLN
INTRAMUSCULAR | Status: AC
  Filled 2019-04-08: qty 2

## 2019-04-08 MED ORDER — OXYCODONE HCL 5 MG PO TABS
ORAL_TABLET | ORAL | Status: AC
  Filled 2019-04-08: qty 1

## 2019-04-08 MED ORDER — PROPOFOL 10 MG/ML IV BOLUS
INTRAVENOUS | Status: AC
  Filled 2019-04-08: qty 40

## 2019-04-08 MED ORDER — MIDAZOLAM HCL 2 MG/2ML IJ SOLN
INTRAMUSCULAR | Status: DC | PRN
  Administered 2019-04-08: 2 mg via INTRAVENOUS

## 2019-04-08 MED ORDER — SODIUM CHLORIDE 0.9 % IV SOLN
100.0000 mg | Freq: Two times a day (BID) | INTRAVENOUS | Status: DC
  Administered 2019-04-08: 100 mg via INTRAVENOUS
  Filled 2019-04-08 (×3): qty 100

## 2019-04-08 MED ORDER — MIDAZOLAM HCL 2 MG/2ML IJ SOLN
INTRAMUSCULAR | Status: AC
  Filled 2019-04-08: qty 2

## 2019-04-08 MED ORDER — OXYCODONE HCL 5 MG/5ML PO SOLN
5.0000 mg | Freq: Once | ORAL | Status: AC | PRN
  Filled 2019-04-08: qty 5

## 2019-04-08 MED ORDER — KETOROLAC TROMETHAMINE 30 MG/ML IJ SOLN
INTRAMUSCULAR | Status: DC | PRN
  Administered 2019-04-08: 30 mg via INTRAVENOUS

## 2019-04-08 MED ORDER — OXYCODONE HCL 5 MG PO TABS
5.0000 mg | ORAL_TABLET | Freq: Once | ORAL | Status: AC | PRN
  Administered 2019-04-08: 5 mg via ORAL
  Filled 2019-04-08: qty 1

## 2019-04-08 MED ORDER — ACETAMINOPHEN 500 MG PO TABS
ORAL_TABLET | ORAL | Status: AC
  Filled 2019-04-08: qty 2

## 2019-04-08 MED ORDER — DEXAMETHASONE SODIUM PHOSPHATE 10 MG/ML IJ SOLN
INTRAMUSCULAR | Status: AC
  Filled 2019-04-08: qty 1

## 2019-04-08 MED ORDER — FAMOTIDINE 20 MG PO TABS
ORAL_TABLET | ORAL | Status: AC
  Filled 2019-04-08: qty 1

## 2019-04-08 MED ORDER — LIDOCAINE HCL (CARDIAC) PF 100 MG/5ML IV SOSY
PREFILLED_SYRINGE | INTRAVENOUS | Status: DC | PRN
  Administered 2019-04-08: 40 mg via INTRAVENOUS

## 2019-04-08 MED ORDER — CHLOROPROCAINE HCL 1 % IJ SOLN
INTRAMUSCULAR | Status: DC | PRN
  Administered 2019-04-08: 20 mL

## 2019-04-08 MED ORDER — PROMETHAZINE HCL 25 MG/ML IJ SOLN
6.2500 mg | INTRAMUSCULAR | Status: DC | PRN
  Filled 2019-04-08: qty 1

## 2019-04-08 MED ORDER — ONDANSETRON HCL 4 MG/2ML IJ SOLN
INTRAMUSCULAR | Status: DC | PRN
  Administered 2019-04-08: 4 mg via INTRAVENOUS

## 2019-04-08 MED ORDER — PROPOFOL 10 MG/ML IV BOLUS
INTRAVENOUS | Status: DC | PRN
  Administered 2019-04-08: 20 mg via INTRAVENOUS
  Administered 2019-04-08 (×3): 10 mg via INTRAVENOUS
  Administered 2019-04-08: 20 mg via INTRAVENOUS
  Administered 2019-04-08: 100 ug/kg/min via INTRAVENOUS
  Administered 2019-04-08: 20 mg via INTRAVENOUS
  Administered 2019-04-08: 40 mg via INTRAVENOUS

## 2019-04-08 MED ORDER — ONDANSETRON HCL 4 MG/2ML IJ SOLN
INTRAMUSCULAR | Status: AC
  Filled 2019-04-08: qty 2

## 2019-04-08 MED ORDER — METHYLERGONOVINE MALEATE 0.2 MG/ML IJ SOLN
INTRAMUSCULAR | Status: AC
  Filled 2019-04-08: qty 1

## 2019-04-08 MED ORDER — KETOROLAC TROMETHAMINE 30 MG/ML IJ SOLN
INTRAMUSCULAR | Status: AC
  Filled 2019-04-08: qty 1

## 2019-04-08 MED ORDER — FAMOTIDINE 20 MG PO TABS
20.0000 mg | ORAL_TABLET | Freq: Once | ORAL | Status: AC
  Administered 2019-04-08: 20 mg via ORAL
  Filled 2019-04-08: qty 1

## 2019-04-08 MED ORDER — ACETAMINOPHEN 500 MG PO TABS
1000.0000 mg | ORAL_TABLET | Freq: Once | ORAL | Status: AC
  Administered 2019-04-08: 07:00:00 1000 mg via ORAL
  Filled 2019-04-08: qty 2

## 2019-04-08 MED ORDER — LIDOCAINE 2% (20 MG/ML) 5 ML SYRINGE
INTRAMUSCULAR | Status: AC
  Filled 2019-04-08: qty 5

## 2019-04-08 MED ORDER — HYDROMORPHONE HCL 1 MG/ML IJ SOLN
0.2500 mg | INTRAMUSCULAR | Status: DC | PRN
  Filled 2019-04-08: qty 0.5

## 2019-04-08 MED ORDER — LACTATED RINGERS IV SOLN
INTRAVENOUS | Status: DC
  Filled 2019-04-08: qty 1000

## 2019-04-08 SURGICAL SUPPLY — 22 items
CATH ROBINSON RED A/P 16FR (CATHETERS) ×2 IMPLANT
COVER WAND RF STERILE (DRAPES) ×2 IMPLANT
DECANTER SPIKE VIAL GLASS SM (MISCELLANEOUS) ×2 IMPLANT
FILTER UTR ASPR ASSEMBLY (MISCELLANEOUS) ×2 IMPLANT
GLOVE BIOGEL PI IND STRL 7.0 (GLOVE) ×1 IMPLANT
GLOVE BIOGEL PI IND STRL 7.5 (GLOVE) IMPLANT
GLOVE BIOGEL PI INDICATOR 7.0 (GLOVE) ×2
GLOVE BIOGEL PI INDICATOR 7.5 (GLOVE) ×1
GLOVE ECLIPSE 8.0 STRL XLNG CF (GLOVE) ×2 IMPLANT
GOWN STRL REUS W/TWL LRG LVL3 (GOWN DISPOSABLE) ×2 IMPLANT
HOSE CONNECTING 18IN BERKELEY (TUBING) ×2 IMPLANT
KIT BERKELEY 1ST TRI 3/8 NO TR (MISCELLANEOUS) ×2 IMPLANT
KIT BERKELEY 1ST TRIMESTER 3/8 (MISCELLANEOUS) ×4 IMPLANT
KIT TURNOVER CYSTO (KITS) ×2 IMPLANT
NS IRRIG 500ML POUR BTL (IV SOLUTION) ×2 IMPLANT
PACK VAGINAL MINOR WOMEN LF (CUSTOM PROCEDURE TRAY) ×2 IMPLANT
TOWEL OR 17X26 10 PK STRL BLUE (TOWEL DISPOSABLE) ×2 IMPLANT
TRAP TISSUE FILTER (MISCELLANEOUS) ×4 IMPLANT
VACURETTE 10 RIGID CVD (CANNULA) IMPLANT
VACURETTE 7MM CVD STRL WRAP (CANNULA) ×2 IMPLANT
VACURETTE 8 RIGID CVD (CANNULA) ×1 IMPLANT
VACURETTE 9 RIGID CVD (CANNULA) ×1 IMPLANT

## 2019-04-08 NOTE — Brief Op Note (Signed)
04/08/2019  8:10 AM  PATIENT:  Kathrine Cords  29 y.o. female  PRE-OPERATIVE DIAGNOSIS:  Retained Products of Conception, incomplete SAB  POST-OPERATIVE DIAGNOSIS:  Retained Products of Conception, incomplete sab  PROCEDURE:  ultrasound guided suction Dilation and evacuation  SURGEON:  Surgeon(s) and Role:    * Servando Salina, MD - Primary  PHYSICIAN ASSISTANT:   ASSISTANTS: none   ANESTHESIA:   paracervical block and MAC  EBL:  5 ml  BLOOD ADMINISTERED:none  DRAINS: none   LOCAL MEDICATIONS USED:  OTHER 1% nesicaine  SPECIMEN:  Source of Specimen:  POC  DISPOSITION OF SPECIMEN:  PATHOLOGY  COUNTS:  YES  TOURNIQUET:  * No tourniquets in log *  DICTATION: .Other Dictation: Dictation Number [id number]  PLAN OF CARE: Discharge to home after PACU  PATIENT DISPOSITION:  PACU - hemodynamically stable.   Delay start of Pharmacological VTE agent (>24hrs) due to surgical blood loss or risk of bleeding: no

## 2019-04-08 NOTE — Anesthesia Postprocedure Evaluation (Signed)
Anesthesia Post Note  Patient: Monique [name]  Procedure(s) Performed: DILATATION AND EVACUATION (N/A Vagina ) OPERATIVE ULTRASOUND (N/A Vagina )     Patient location during evaluation: PACU Anesthesia Type: MAC Level of consciousness: awake and alert Pain management: pain level controlled Vital Signs Assessment: post-procedure vital signs reviewed and stable Respiratory status: spontaneous breathing, nonlabored ventilation, respiratory function stable and patient connected to nasal cannula oxygen Cardiovascular status: stable and blood pressure returned to baseline Postop Assessment: no apparent nausea or vomiting Anesthetic complications: no    Last Vitals:  Vitals:   04/08/19 0830 04/08/19 0926  BP: 115/80 (!) 112/94  Pulse: 77 80  Resp: 15 16  Temp:  36.6 C  SpO2: 100% 100%    Last Pain:  Vitals:   04/08/19 0852  TempSrc:   PainSc: 6                  Lima Chillemi P Allaina Brotzman

## 2019-04-08 NOTE — H&P (Signed)
Monique [name] is an 29 y.o. femaleG1P0010 SF here for surgical mgmt of retained POC. Pt had dx of missed ab. She took cytotec and passed some sac but has remaining tissue present on sonogram. Pt declines IPAS in the office  Pertinent Gynecological History: Menses: regular every month without intermenstrual spotting  Menstrual History: Menarche age: n/a Patient's last menstrual period was 12/24/2018.    Past Medical History:  Diagnosis Date  . ADHD   . Family history of factor V Leiden mutation    pt has not been tested at this time  . GERD (gastroesophageal reflux disease)     Past Surgical History:  Procedure Laterality Date  . WISDOM TOOTH EXTRACTION      History reviewed. No pertinent family history.  Social History:  reports that she has never smoked. She has never used smokeless tobacco. She reports current alcohol use. She reports previous drug use. Drug: Marijuana.  Allergies: No Known Allergies  Medications Prior to Admission  Medication Sig Dispense Refill Last Dose  . ALPRAZolam (XANAX) 0.5 MG tablet Take 0.5 mg by mouth at bedtime as needed for anxiety.   04/08/2019 at 0445  . amphetamine-dextroamphetamine (ADDERALL) 30 MG tablet Take 30 mg by mouth 2 (two) times daily. Takes 1/2 tab is 15 mg   04/07/2019 at Unknown time  . ibuprofen (ADVIL,MOTRIN) 200 MG tablet Take 800 mg by mouth every 8 (eight) hours as needed for mild pain.   Past Week at Unknown time  . loratadine (CLARITIN) 10 MG tablet Take 10 mg by mouth daily. [facility] loratadine   04/08/2019 at 0430    Review of Systems  All other systems reviewed and are negative.   Blood pressure 125/90, pulse 96, temperature 97.6 F (36.4 C), temperature source Oral, resp. rate 14, height 5\' 4"  (1.626 m), weight 75.7 kg, last menstrual period 12/24/2018, SpO2 100 %. Physical Exam  Constitutional: She is oriented to person, place, and time. She appears well-developed and well-nourished.  HENT:  Head: Atraumatic.   Eyes: EOM are normal.  Cardiovascular: Regular rhythm.  Respiratory: Effort normal.  GI: Soft.  Genitourinary:    Vagina and uterus normal.   Musculoskeletal:        General: Normal range of motion.     Cervical back: Neck supple.  Neurological: She is alert and oriented to person, place, and time.  Skin: Skin is warm and dry.    Results for orders placed or performed during the hospital encounter of 04/08/19 (from the past 24 hour(s))  CBC     Status: None   Collection Time: 04/08/19  6:19 AM  Result Value Ref Range   WBC 7.3 4.0 - 10.5 K/uL   RBC 4.32 3.87 - 5.11 MIL/uL   Hemoglobin 13.5 12.0 - 15.0 g/dL   HCT 04/10/19 66.0 - 63.0 %   MCV 92.4 80.0 - 100.0 fL   MCH 31.3 26.0 - 34.0 pg   MCHC 33.8 30.0 - 36.0 g/dL   RDW 16.0 10.9 - 32.3 %   Platelets 280 150 - 400 K/uL   nRBC 0.0 0.0 - 0.2 %    No results found.  Assessment/Plan: Incomplete SAB P) u/s guided suction D&E. Risk of surgery reviewed including infection, bleeding injury to surrounding organ, uterine perforation , internal scar tissue  Shakeria Robinette A Ahlia Lemanski 04/08/2019, 6:51 AM

## 2019-04-08 NOTE — Transfer of Care (Signed)
Immediate Anesthesia Transfer of Care Note  Patient: Monique [name]  Procedure(s) Performed: DILATATION AND EVACUATION (N/A Vagina ) OPERATIVE ULTRASOUND (N/A Vagina )  Patient Location: PACU  Anesthesia Type:MAC  Level of Consciousness: awake, alert , oriented and patient cooperative  Airway & Oxygen Therapy: Patient Spontanous Breathing  Post-op Assessment: Report given to RN and Post -op Vital signs reviewed and stable  Post vital signs: Reviewed and stable  Last Vitals:  Vitals Value Taken Time  BP 119/77 04/08/19 0807  Temp 36.6 C 04/08/19 0807  Pulse 93 04/08/19 0808  Resp 16 04/08/19 0808  SpO2 100 % 04/08/19 0808  Vitals shown include unvalidated device data.  Last Pain:  Vitals:   04/08/19 0638  TempSrc: Oral  PainSc: 0-No pain      Patients Stated Pain Goal: 3 (70/35/00 9381)  Complications: No apparent anesthesia complications

## 2019-04-08 NOTE — Interval H&P Note (Signed)
History and Physical Interval Note:  04/08/2019 7:38 AM  Monique [name]  has presented today for surgery, with the diagnosis of Retained Products of Conception.  The various methods of treatment have been discussed with the patient and family. After consideration of risks, benefits and other options for treatment, the patient has consented to  Procedure(s): DILATATION AND EVACUATION (N/A) OPERATIVE ULTRASOUND (N/A) as a surgical intervention.  The patient's history has been reviewed, patient examined, no change in status, stable for surgery.  I have reviewed the patient's chart and labs.  Questions were answered to the patient's satisfaction.     Amun Stemm A Keghan Mcfarren

## 2019-04-08 NOTE — Discharge Instructions (Signed)
CALL  IF TEMP>100.4, NOTHING PER VAGINA X 2 WK, CALL IF SOAKING A MAXI  PAD EVERY HOUR OR MORE FREQUENTLYCALL  IF TEMP>100.4, NOTHING PER VAGINA X 2 WK, CALL IF SOAKING A MAXI  PAD EVERY HOUR OR MORE FREQUENTLY  DISCHARGE INSTRUCTIONS: D&C / D&E The following instructions have been prepared to help you care for yourself upon your return home.   Personal hygiene: Marland Kitchen Use sanitary pads for vaginal drainage, not tampons. . Shower the day after your procedure. . NO tub baths, pools or Jacuzzis for 2-3 weeks. . Wipe front to back after using the bathroom.  Activity and limitations: . Do NOT drive or operate any equipment for 24 hours. The effects of anesthesia are still present and drowsiness may result. . Do NOT rest in bed all day. . Walking is encouraged. . Walk up and down stairs slowly. . You may resume your normal activity in one to two days or as indicated by your physician.  Sexual activity: NO intercourse for at least 2 weeks after the procedure, or as indicated by your physician.  Diet: Eat a light meal as desired this evening. You may resume your usual diet tomorrow.  Return to work: You may resume your work activities in one to two days or as indicated by your doctor.  What to expect after your surgery: Expect to have vaginal bleeding/discharge for 2-3 days and spotting for up to 10 days. It is not unusual to have soreness for up to 1-2 weeks. You may have a slight burning sensation when you urinate for the first day. Mild cramps may continue for a couple of days. You may have a regular period in 2-6 weeks.  Call your doctor for any of the following: . Excessive vaginal bleeding, saturating and changing one pad every hour. . Inability to urinate 6 hours after discharge from hospital. . Pain not relieved by pain medication. . Fever of 100.4 F or greater. . Unusual vaginal discharge or odor.    Post Anesthesia Home Care Instructions  Activity: Get plenty of rest for the  remainder of the day. A responsible individual must stay with you for 24 hours following the procedure.  For the next 24 hours, DO NOT: -Drive a car -Advertising copywriter -Drink alcoholic beverages -Take any medication unless instructed by your physician -Make any legal decisions or sign important papers.  Meals: Start with liquid foods such as gelatin or soup. Progress to regular foods as tolerated. Avoid greasy, spicy, heavy foods. If nausea and/or vomiting occur, drink only clear liquids until the nausea and/or vomiting subsides. Call your physician if vomiting continues.  Special Instructions/Symptoms: Your throat may feel dry or sore from the anesthesia or the breathing tube placed in your throat during surgery. If this causes discomfort, gargle with warm salt water. The discomfort should disappear within 24 hours.

## 2019-04-09 NOTE — Op Note (Signed)
Monique [name], BUCZKOWSKI MEDICAL RECORD [id number] ACCOUNT [id number] DATE OF BIRTH:03-May-1990 FACILITY: [facility] LOCATION: [facility] PHYSICIAN:Brexley Cutshaw A. Jahred Tatar, MD  OPERATIVE REPORT  DATE OF PROCEDURE:  04/08/2019  PREOPERATIVE DIAGNOSES:   1.  Retained products of conception.  2.  Incomplete spontaneous abortion.  PROCEDURE:  Ultrasound-guided suction dilation and evacuation.  POSTOPERATIVE DIAGNOSES:   1.  Retained products of conception.   2.  Incomplete spontaneous  abortion.  ANESTHESIA:  MAC, paracervical block.  SURGEON:  Servando Salina, MD  ASSISTANT:  None.  DESCRIPTION OF PROCEDURE:  Under adequate monitored anesthesia, the patient was placed in the dorsal lithotomy position.  The bladder was partially catheterized.  The patient was then sterilely prepped and draped in the usual fashion.  A bivalve speculum  was placed in the vagina.  Single-tooth tenaculum was placed on the anterior lip of the cervix.  The cervix was easily dilated up to #[facility] dilator.  With ultrasound guidance, a 7 mm curved suction cannula was introduced into the uterine cavity.  A  moderate amount of tissue was obtained.  The cavity was gently curetted and suctioned.  When all tissue was felt to have been removed, all instruments were then removed from the vagina.    SPECIMEN:  Labeled products of conception were sent to pathology.  ESTIMATED BLOOD LOSS:  Minimal.  COMPLICATIONS:  None.  DISPOSITION:  The patient tolerated the procedure well and was transferred to recovery room in stable condition.  VN/NUANCE  D:04/08/2019 T:04/09/2019 JOB:[id number]

## 2019-04-09 NOTE — Op Note (Deleted)
  The note originally documented on this encounter has been moved the the encounter in which it belongs.  

## 2019-04-11 LAB — SURGICAL PATHOLOGY

## 2019-04-25 DIAGNOSIS — Z6828 Body mass index (BMI) 28.0-28.9, adult: Secondary | ICD-10-CM | POA: Diagnosis not present

## 2019-04-25 DIAGNOSIS — F909 Attention-deficit hyperactivity disorder, unspecified type: Secondary | ICD-10-CM | POA: Diagnosis not present

## 2019-04-25 DIAGNOSIS — E663 Overweight: Secondary | ICD-10-CM | POA: Diagnosis not present

## 2019-05-24 DIAGNOSIS — F4322 Adjustment disorder with anxiety: Secondary | ICD-10-CM | POA: Diagnosis not present

## 2019-05-24 DIAGNOSIS — F900 Attention-deficit hyperactivity disorder, predominantly inattentive type: Secondary | ICD-10-CM | POA: Diagnosis not present

## 2019-05-27 DIAGNOSIS — E663 Overweight: Secondary | ICD-10-CM | POA: Diagnosis not present

## 2019-05-27 DIAGNOSIS — Z6827 Body mass index (BMI) 27.0-27.9, adult: Secondary | ICD-10-CM | POA: Diagnosis not present

## 2019-07-25 DIAGNOSIS — F909 Attention-deficit hyperactivity disorder, unspecified type: Secondary | ICD-10-CM | POA: Diagnosis not present

## 2019-07-25 DIAGNOSIS — L237 Allergic contact dermatitis due to plants, except food: Secondary | ICD-10-CM | POA: Diagnosis not present

## 2019-10-10 DIAGNOSIS — F988 Other specified behavioral and emotional disorders with onset usually occurring in childhood and adolescence: Secondary | ICD-10-CM | POA: Diagnosis not present

## 2019-10-28 DIAGNOSIS — D225 Melanocytic nevi of trunk: Secondary | ICD-10-CM | POA: Diagnosis not present

## 2019-10-28 DIAGNOSIS — D2262 Melanocytic nevi of left upper limb, including shoulder: Secondary | ICD-10-CM | POA: Diagnosis not present

## 2019-10-28 DIAGNOSIS — L718 Other rosacea: Secondary | ICD-10-CM | POA: Diagnosis not present

## 2019-10-28 DIAGNOSIS — D2261 Melanocytic nevi of right upper limb, including shoulder: Secondary | ICD-10-CM | POA: Diagnosis not present

## 2019-11-08 DIAGNOSIS — N925 Other specified irregular menstruation: Secondary | ICD-10-CM | POA: Diagnosis not present

## 2019-11-08 DIAGNOSIS — Z3201 Encounter for pregnancy test, result positive: Secondary | ICD-10-CM | POA: Diagnosis not present

## 2019-11-08 DIAGNOSIS — R112 Nausea with vomiting, unspecified: Secondary | ICD-10-CM | POA: Diagnosis not present

## 2019-11-14 DIAGNOSIS — N912 Amenorrhea, unspecified: Secondary | ICD-10-CM | POA: Diagnosis not present

## 2019-11-26 LAB — OB RESULTS CONSOLE HEPATITIS B SURFACE ANTIGEN: Hepatitis B Surface Ag: NEGATIVE

## 2019-12-06 DIAGNOSIS — Z114 Encounter for screening for human immunodeficiency virus [HIV]: Secondary | ICD-10-CM | POA: Diagnosis not present

## 2019-12-06 DIAGNOSIS — Z3481 Encounter for supervision of other normal pregnancy, first trimester: Secondary | ICD-10-CM | POA: Diagnosis not present

## 2019-12-06 DIAGNOSIS — Z113 Encounter for screening for infections with a predominantly sexual mode of transmission: Secondary | ICD-10-CM | POA: Diagnosis not present

## 2019-12-06 DIAGNOSIS — Z131 Encounter for screening for diabetes mellitus: Secondary | ICD-10-CM | POA: Diagnosis not present

## 2019-12-06 DIAGNOSIS — Z1329 Encounter for screening for other suspected endocrine disorder: Secondary | ICD-10-CM | POA: Diagnosis not present

## 2019-12-06 LAB — OB RESULTS CONSOLE RUBELLA ANTIBODY, IGM: Rubella: NON-IMMUNE/NOT IMMUNE

## 2019-12-06 LAB — OB RESULTS CONSOLE VARICELLA ZOSTER ANTIBODY, IGG: Varicella: IMMUNE

## 2020-01-10 DIAGNOSIS — F909 Attention-deficit hyperactivity disorder, unspecified type: Secondary | ICD-10-CM | POA: Diagnosis not present

## 2020-01-20 DIAGNOSIS — M5489 Other dorsalgia: Secondary | ICD-10-CM | POA: Diagnosis not present

## 2020-01-23 DIAGNOSIS — R609 Edema, unspecified: Secondary | ICD-10-CM | POA: Diagnosis not present

## 2020-04-24 ENCOUNTER — Inpatient Hospital Stay (HOSPITAL_COMMUNITY): Admission: AD | Admit: 2020-04-24 | Payer: 59 | Source: Home / Self Care | Admitting: Obstetrics and Gynecology

## 2020-05-13 ENCOUNTER — Ambulatory Visit: Admit: 2020-05-13 | Discharge status: Against Medical Advice | Payer: Self-pay

## 2020-05-13 ENCOUNTER — Encounter: Payer: Self-pay | Admitting: Emergency Medicine

## 2020-05-13 ENCOUNTER — Ambulatory Visit
Admission: EM | Admit: 2020-05-13 | Discharge: 2020-05-13 | Disposition: A | Discharge status: Home / Self Care | Payer: 59 | Attending: Family Medicine | Admitting: Family Medicine

## 2020-05-13 ENCOUNTER — Other Ambulatory Visit: Payer: Self-pay

## 2020-05-13 DIAGNOSIS — Z20822 Contact with and (suspected) exposure to covid-19: Secondary | ICD-10-CM | POA: Diagnosis not present

## 2020-05-13 DIAGNOSIS — J069 Acute upper respiratory infection, unspecified: Secondary | ICD-10-CM

## 2020-05-13 DIAGNOSIS — H9209 Otalgia, unspecified ear: Secondary | ICD-10-CM | POA: Diagnosis not present

## 2020-05-13 DIAGNOSIS — Z79899 Other long term (current) drug therapy: Secondary | ICD-10-CM | POA: Diagnosis not present

## 2020-05-13 DIAGNOSIS — J029 Acute pharyngitis, unspecified: Secondary | ICD-10-CM | POA: Diagnosis not present

## 2020-05-13 DIAGNOSIS — O26893 Other specified pregnancy related conditions, third trimester: Secondary | ICD-10-CM | POA: Diagnosis present

## 2020-05-13 DIAGNOSIS — Z3A34 34 weeks gestation of pregnancy: Secondary | ICD-10-CM | POA: Insufficient documentation

## 2020-05-13 LAB — SARS CORONAVIRUS 2 (TAT 6-24 HRS): SARS Coronavirus 2: NEGATIVE

## 2020-05-13 NOTE — ED Provider Notes (Signed)
[facility]    CSN: [id number] Arrival date & time: 05/13/20  1120      History   Chief Complaint Chief Complaint  Patient presents with  . Cough  . Nasal Congestion  . Sore Throat   HPI   30 year old female presents with respiratory symptoms.  Symptoms started 4 days ago.  Her husband has been sick as well.  She reports cough, sneezing, congestion, ear pain.  No fever.  She is currently [redacted] weeks pregnant.  She has used Tylenol Cold and flu as well as Mucinex without resolution.  She is concerned that she does not seem to be improving.  No known exacerbating factors.  No other associated symptoms.  No other complaints.  Past Medical History:  Diagnosis Date  . ADHD   . Family history of factor V Leiden mutation    pt has not been tested at this time  . GERD (gastroesophageal reflux disease)    Past Surgical History:  Procedure Laterality Date  . DILATION AND EVACUATION N/A 04/08/2019   Procedure: DILATATION AND EVACUATION;  Surgeon: Maxie Better, MD;  Location: [facility] [place];  Service: Gynecology;  Laterality: N/A;  . OPERATIVE ULTRASOUND N/A 04/08/2019   Procedure: OPERATIVE ULTRASOUND;  Surgeon: Maxie Better, MD;  Location: [facility] [place];  Service: Gynecology;  Laterality: N/A;  . WISDOM TOOTH EXTRACTION      OB History    Gravida  1   Para      Term      Preterm      AB      Living        SAB      IAB      Ectopic      Multiple      Live Births               Home Medications    Prior to Admission medications   Medication Sig Start Date End Date Taking? Authorizing Provider  amphetamine-dextroamphetamine (ADDERALL) 30 MG tablet Take 30 mg by mouth 2 (two) times daily. Takes 1/2 tab is 15 mg   Yes [provider]  dicyclomine (BENTYL) 10 MG capsule Take 1 capsule by mouth 3 (three) times daily. 10/21/19  Yes [provider]  famotidine (PEPCID) 20 MG tablet Take 20 mg by  mouth 2 (two) times daily.   Yes [provider]  Prenatal 28-0.8 MG TABS Take 1 tablet by mouth daily.   Yes [provider]  loratadine (CLARITIN) 10 MG tablet Take 10 mg by mouth daily. [facility] loratadine  05/13/20  [provider]    Family History Family History  Problem Relation Age of Onset  . Healthy Mother   . Factor V Leiden deficiency Father     Social History Social History   Tobacco Use  . Smoking status: Never Smoker  . Smokeless tobacco: Never Used  Vaping Use  . Vaping Use: Never used  Substance Use Topics  . Alcohol use: Not Currently    Comment: occassionally  . Drug use: Not Currently    Types: Marijuana    Comment: marijuana last used last week x 1 as of  04-06-2019     Allergies   Patient has no known allergies.   Review of Systems Review of Systems Per HPI  Physical Exam Triage Vital Signs ED Triage Vitals  Enc Vitals Group     BP 05/13/20 1132 115/87     Pulse Rate 05/13/20 1132 98  Resp 05/13/20 1132 18     Temp 05/13/20 1132 98.2 F (36.8 C)     Temp Source 05/13/20 1132 Oral     SpO2 05/13/20 1132 100 %     Weight 05/13/20 1134 195 lb (88.5 kg)     Height 05/13/20 1134 5\' 4"  (1.626 m)     Head Circumference --      Peak Flow --      Pain Score 05/13/20 1133 4     Pain Loc --      Pain Edu? --      Excl. in GC? --     Updated Vital Signs BP 115/87 (BP Location: Left Arm)   Pulse 98   Temp 98.2 F (36.8 C) (Oral)   Resp 18   Ht 5\' 4"  (1.626 m)   Wt 88.5 kg Comment: pregnant  LMP 09/15/2019 (Exact Date)   SpO2 100%   BMI 33.47 kg/m   Visual Acuity Right Eye Distance:   Left Eye Distance:   Bilateral Distance:    Right Eye Near:   Left Eye Near:    Bilateral Near:     Physical Exam Vitals and nursing note reviewed.  Constitutional:      General: She is not in acute distress.    Appearance: Normal appearance. She is not ill-appearing.  HENT:     Head: Normocephalic and atraumatic.      Right Ear: Tympanic membrane normal.     Left Ear: Tympanic membrane normal.     Nose: Congestion present.  Eyes:     General:        Right eye: No discharge.        Left eye: No discharge.     Conjunctiva/sclera: Conjunctivae normal.  Cardiovascular:     Rate and Rhythm: Normal rate and regular rhythm.  Pulmonary:     Effort: Pulmonary effort is normal.     Breath sounds: Normal breath sounds. No wheezing, rhonchi or rales.  Neurological:     Mental Status: She is alert.  Psychiatric:        Mood and Affect: Mood normal.        Behavior: Behavior normal.    UC Treatments / Results  Labs (all labs ordered are listed, but only abnormal results are displayed) Labs Reviewed  SARS CORONAVIRUS 2 (TAT 6-24 HRS)    EKG   Radiology No results found.  Procedures Procedures (including critical care time)  Medications Ordered in UC Medications - No data to display  Initial Impression / Assessment and Plan / UC Course  I have reviewed the triage vital signs and the nursing notes.  Pertinent labs & imaging results that were available during my care of the patient were reviewed by me and considered in my medical decision making (see chart for details).    30 year old female who is currently pregnant presents with a viral respiratory infection.  No evidence of bacterial infection.  Advised continued symptomatic treatment at home.  Supportive care.  Awaiting Covid test results.  Final Clinical Impressions(s) / UC Diagnoses   Final diagnoses:  Viral upper respiratory tract infection   Discharge Instructions   None    ED Prescriptions    None     PDMP not reviewed this encounter.   09/17/2019, 26 05/13/20 1406

## 2020-05-13 NOTE — ED Triage Notes (Signed)
Patient in today c/o cough, nasal congestion and sore throat x 4 days. Patient denies fever. Patient has taken OTC Tylenol cold/flu and Mucinex. Patient is 34.[redacted] weeks pregnant. Patient has not had the covid vaccines. Patient had a PCR covid test on 05/09/20 for work and it was negative.

## 2020-05-22 ENCOUNTER — Other Ambulatory Visit: Payer: Self-pay

## 2020-05-22 ENCOUNTER — Encounter: Payer: 59 | Admitting: *Deleted

## 2020-05-22 ENCOUNTER — Encounter: Payer: Self-pay | Admitting: *Deleted

## 2020-05-22 VITALS — BP 110/72 | Ht 64.0 in | Wt 193.3 lb

## 2020-05-22 DIAGNOSIS — O24410 Gestational diabetes mellitus in pregnancy, diet controlled: Secondary | ICD-10-CM | POA: Diagnosis not present

## 2020-05-22 NOTE — Progress Notes (Signed)
Diabetes Self-Management Education  Visit Type: First/Initial  Appt. Start Time: 1340 Appt. End Time: 1505  05/22/2020  [name], identified by name and date of birth, is a 30 y.o. female with a diagnosis of Diabetes: Gestational Diabetes.   ASSESSMENT  Blood pressure 110/72, height 5\' 4"  (1.626 m), weight 193 lb 4.8 oz (87.7 kg), last menstrual period 09/15/2019, estimated date of delivery 06/21/2020 Body mass index is 33.18 kg/m.   Diabetes Self-Management Education - 05/22/20 1522      Visit Information   Visit Type First/Initial      Initial Visit   Diabetes Type Gestational Diabetes    Are you currently following a meal plan? Yes    What type of meal plan do you follow? "less sugars and carbs"    Are you taking your medications as prescribed? Yes    Date Diagnosed 2-3 weeks      Health Coping   How would you rate your overall health? Good      Psychosocial Assessment   Patient Belief/Attitude about Diabetes Other (comment)   "annoyed"   Self-care barriers None    Self-management support Doctor's office;Family    Patient Concerns Nutrition/Meal planning;Glycemic Control;Monitoring    Special Needs None    Preferred Learning Style Hands on    Learning Readiness Change in progress    How often do you need to have someone help you when you read instructions, pamphlets, or other written materials from your doctor or pharmacy? 1 - Never    What is the last grade level you completed in school? Masters      Pre-Education Assessment   Patient understands the diabetes disease and treatment process. Needs Instruction    Patient understands incorporating nutritional management into lifestyle. Needs Instruction    Patient undertands incorporating physical activity into lifestyle. Needs Instruction    Patient understands using medications safely. Needs Instruction    Patient understands monitoring blood glucose, interpreting and using results Needs Review    Patient  understands prevention, detection, and treatment of acute complications. Needs Instruction    Patient understands prevention, detection, and treatment of chronic complications. Needs Instruction    Patient understands how to develop strategies to address psychosocial issues. Needs Instruction    Patient understands how to develop strategies to promote health/change behavior. Needs Instruction      Complications   Last HgB A1C per patient/outside source 5.1 %   11/26/2019   How often do you check your blood sugar? 3-4 times/day    Fasting Blood glucose range (mg/dL) 01/26/2020   FBG's 02-542 mg/dL 4/7 out of range   Postprandial Blood glucose range (mg/dL) 6/7   pp breakfast 94-125 mg/dL 1/7 out of range; pp lunch 93-120 mg/dL; pp supper 3/7 mg/dL 1/6 out of range   Have you had a dilated eye exam in the past 12 months? No    Have you had a dental exam in the past 12 months? Yes    Are you checking your feet? No      Dietary Intake   Breakfast egg white bites with broccoli and cheese; 1/2 bagel; protein shake, apple    Snack (morning) granola bar, nutrigran bar    Lunch tuna sandwich, peanut butter sandwich, salmon, BBQ, pizza    Snack (afternoon) nuts, pretzels and peanut butter    Dinner chicken, salmon, beef; beans, occasional potatoes, corn and spaghetti; likes non-starchy vegetables - broccoli, zucchini, green beans, spinach, salads    Snack (evening) apple, cuties,  peaches, blueberries    Beverage(s) water, Crystal lite      Exercise   Exercise Type ADL's      Patient Education   Previous Diabetes Education No    Disease state  Definition of diabetes, type 1 and 2, and the diagnosis of diabetes;Factors that contribute to the development of diabetes    Nutrition management  Role of diet in the treatment of diabetes and the relationship between the three main macronutrients and blood glucose level;Food label reading, portion sizes and measuring food.;Reviewed blood glucose  goals for pre and post meals and how to evaluate the patients' food intake on their blood glucose level.    Physical activity and exercise  Role of exercise on diabetes management, blood pressure control and cardiac health.    Medications Other (comment)   Limited use of oral medications during pregnancy and potential for insulin   Monitoring Purpose and frequency of SMBG.;Taught/discussed recording of test results and interpretation of SMBG.;Identified appropriate SMBG and/or A1C goals.;Ketone testing, when, how.    Chronic complications Relationship between chronic complications and blood glucose control    Psychosocial adjustment Identified and addressed patients feelings and concerns about diabetes    Preconception care Pregnancy and GDM  Role of pre-pregnancy blood glucose control on the development of the fetus;Reviewed with patient blood glucose goals with pregnancy;Role of family planning for patients with diabetes      Individualized Goals (developed by patient)   Reducing Risk Other (comment)   improve blood sugars, prevent diabetes complications, become more fit     Outcomes   Expected Outcomes Demonstrated interest in learning. Expect positive outcomes    Program Status Not Completed           Individualized Plan for Diabetes Self-Management Training:   Learning Objective:  Patient will have a greater understanding of diabetes self-management. Patient education plan is to attend individual and/or group sessions per assessed needs and concerns.   Plan:   Patient Instructions  Read booklet on Gestational Diabetes Follow Gestational Meal Planning Guidelines Avoid fruit at bedtime Allow 2-3 hours between meals and snacks Check blood sugars 4 x day - before breakfast and 2 hrs after every meal and record  Bring blood sugar log to all MD appointments Purchase urine ketone strips if instructed by MD and check urine ketones every am:  If + increase bedtime snack to 1 protein and  2 carbohydrate servings Walk 20-30 minutes at least 5 x week if permitted by MD  Expected Outcomes:  Demonstrated interest in learning. Expect positive outcomes  Education material provided: Gestational Booklet Gestational Meal Planning Guidelines Simple Meal Plan Viewed Gestational Diabetes Video Goals for a Healthy Pregnancy  If problems or questions, patient to contact team via:  Sharion Settler, RN, CCM, CDCES [phone]  Future DSME appointment: Pt has chosen not to return for an appointment with the dietitian since she is close to deliver.

## 2020-05-22 NOTE — Patient Instructions (Addendum)
Read booklet on Gestational Diabetes Follow Gestational Meal Planning Guidelines Avoid fruit at bedtime Allow 2-3 hours between meals and snacks Check blood sugars 4 x day - before breakfast and 2 hrs after every meal and record  Bring blood sugar log to all MD appointments Purchase urine ketone strips if instructed by MD and check urine ketones every am:  If + increase bedtime snack to 1 protein and 2 carbohydrate servings Walk 20-30 minutes at least 5 x week if permitted by MD

## 2020-05-24 LAB — OB RESULTS CONSOLE RPR: RPR: NONREACTIVE

## 2020-05-24 LAB — OB RESULTS CONSOLE GBS: GBS: NEGATIVE

## 2020-05-24 LAB — OB RESULTS CONSOLE HIV ANTIBODY (ROUTINE TESTING): HIV: NONREACTIVE

## 2020-05-24 LAB — OB RESULTS CONSOLE GC/CHLAMYDIA
Chlamydia: NEGATIVE
Gonorrhea: NEGATIVE

## 2020-06-11 ENCOUNTER — Other Ambulatory Visit: Payer: Self-pay | Admitting: Obstetrics and Gynecology

## 2020-06-11 NOTE — Progress Notes (Signed)
  Monique [name] is a 30 y.o. G1P0 female at [redacted]w[redacted]d dated by an LMP of 09/15/2019.  She will present to L&D for IOL for GDM diet controlled  Pregnancy Issues: 1. Rubella: Non immune  2. Gestational diabetes     Prenatal care site: [facility]   EFW: 05/15/20:EFW= 5lb15oz(2704g)= 55%   Pertinent Results:  Prenatal Labs: Blood type/Rh B pos  Antibody screen neg  Rubella Non-Immune  Varicella Immune  RPR NR  HBsAg Neg  HIV NR  GC neg  Chlamydia neg  Genetic screening negative  1 hour GTT 138  3 hour GTT   GBS Neg    Post Partum Planning: - Infant feeding: Breast - Contraception: TBD, considering OCPs - Flu: Given 03/21/20 - Tdap:  Haroldine Laws, CNM 06/11/2020 8:50 AM

## 2020-06-13 ENCOUNTER — Inpatient Hospital Stay: Payer: 59 | Admitting: Anesthesiology

## 2020-06-13 ENCOUNTER — Other Ambulatory Visit: Payer: Self-pay | Admitting: Obstetrics and Gynecology

## 2020-06-13 ENCOUNTER — Inpatient Hospital Stay
Admission: EM | Admit: 2020-06-13 | Discharge: 2020-06-16 | DRG: 787 | Disposition: A | Discharge status: Home / Self Care | Payer: 59 | Attending: Obstetrics and Gynecology | Admitting: Obstetrics and Gynecology

## 2020-06-13 ENCOUNTER — Other Ambulatory Visit: Payer: Self-pay

## 2020-06-13 ENCOUNTER — Encounter: Payer: Self-pay | Admitting: Obstetrics and Gynecology

## 2020-06-13 DIAGNOSIS — Z20822 Contact with and (suspected) exposure to covid-19: Secondary | ICD-10-CM | POA: Diagnosis present

## 2020-06-13 DIAGNOSIS — D62 Acute posthemorrhagic anemia: Secondary | ICD-10-CM | POA: Diagnosis not present

## 2020-06-13 DIAGNOSIS — O24420 Gestational diabetes mellitus in childbirth, diet controlled: Principal | ICD-10-CM | POA: Diagnosis present

## 2020-06-13 DIAGNOSIS — O9081 Anemia of the puerperium: Secondary | ICD-10-CM | POA: Diagnosis not present

## 2020-06-13 DIAGNOSIS — O24419 Gestational diabetes mellitus in pregnancy, unspecified control: Secondary | ICD-10-CM | POA: Diagnosis present

## 2020-06-13 DIAGNOSIS — Z3A38 38 weeks gestation of pregnancy: Secondary | ICD-10-CM

## 2020-06-13 DIAGNOSIS — Z3490 Encounter for supervision of normal pregnancy, unspecified, unspecified trimester: Secondary | ICD-10-CM | POA: Diagnosis present

## 2020-06-13 LAB — TYPE AND SCREEN
ABO/RH(D): B POS
Antibody Screen: NEGATIVE

## 2020-06-13 LAB — RESP PANEL BY RT-PCR (FLU A&B, COVID) ARPGX2
Influenza A by PCR: NEGATIVE
Influenza B by PCR: NEGATIVE
SARS Coronavirus 2 by RT PCR: NEGATIVE

## 2020-06-13 LAB — WET PREP, GENITAL
Sperm: NONE SEEN
Trich, Wet Prep: NONE SEEN

## 2020-06-13 LAB — CBC
HCT: 35.6 % — ABNORMAL LOW (ref 36.0–46.0)
Hemoglobin: 12.1 g/dL (ref 12.0–15.0)
MCH: 29.4 pg (ref 26.0–34.0)
MCHC: 34 g/dL (ref 30.0–36.0)
MCV: 86.4 fL (ref 80.0–100.0)
Platelets: 185 10*3/uL (ref 150–400)
RBC: 4.12 MIL/uL (ref 3.87–5.11)
RDW: 13.2 % (ref 11.5–15.5)
WBC: 12.8 10*3/uL — ABNORMAL HIGH (ref 4.0–10.5)
nRBC: 0 % (ref 0.0–0.2)

## 2020-06-13 MED ORDER — SOD CITRATE-CITRIC ACID 500-334 MG/5ML PO SOLN
30.0000 mL | ORAL | Status: DC | PRN
  Administered 2020-06-14: 30 mL via ORAL
  Filled 2020-06-13 (×2): qty 15

## 2020-06-13 MED ORDER — FENTANYL 2.5 MCG/ML W/ROPIVACAINE 0.15% IN NS 100 ML EPIDURAL (ARMC)
EPIDURAL | Status: AC
  Filled 2020-06-13: qty 100

## 2020-06-13 MED ORDER — OXYTOCIN BOLUS FROM INFUSION: 333.0000 mL | Freq: Once | INTRAVENOUS | Status: DC

## 2020-06-13 MED ORDER — OXYTOCIN-SODIUM CHLORIDE 30-0.9 UT/500ML-% IV SOLN
2.5000 [IU]/h | INTRAVENOUS | Status: DC
  Administered 2020-06-14: 30 [IU] via INTRAVENOUS
  Administered 2020-06-15: 2.5 [IU]/h via INTRAVENOUS
  Filled 2020-06-13: qty 1000

## 2020-06-13 MED ORDER — LACTATED RINGERS IV SOLN
500.0000 mL | INTRAVENOUS | Status: DC | PRN
  Administered 2020-06-14: 500 mL via INTRAVENOUS

## 2020-06-13 MED ORDER — ONDANSETRON HCL 4 MG/2ML IJ SOLN
4.0000 mg | Freq: Four times a day (QID) | INTRAMUSCULAR | Status: DC | PRN
  Administered 2020-06-14: 4 mg via INTRAVENOUS
  Filled 2020-06-13 (×2): qty 2

## 2020-06-13 MED ORDER — TERBUTALINE SULFATE 1 MG/ML IJ SOLN: 0.2500 mg | Freq: Once | INTRAMUSCULAR | Status: DC | PRN

## 2020-06-13 MED ORDER — OXYCODONE-ACETAMINOPHEN 5-325 MG PO TABS: 1.0000 | ORAL_TABLET | ORAL | Status: DC | PRN

## 2020-06-13 MED ORDER — LACTATED RINGERS IV SOLN: INTRAVENOUS | Status: DC

## 2020-06-13 MED ORDER — LIDOCAINE HCL (PF) 1 % IJ SOLN: 30.0000 mL | INTRAMUSCULAR | Status: DC | PRN

## 2020-06-13 MED ORDER — OXYCODONE-ACETAMINOPHEN 5-325 MG PO TABS: 2.0000 | ORAL_TABLET | ORAL | Status: DC | PRN

## 2020-06-13 MED ORDER — ACETAMINOPHEN 325 MG PO TABS
650.0000 mg | ORAL_TABLET | ORAL | Status: DC | PRN
  Administered 2020-06-14: 650 mg via ORAL
  Filled 2020-06-13: qty 2

## 2020-06-13 MED ORDER — METRONIDAZOLE 500 MG PO TABS
500.0000 mg | ORAL_TABLET | Freq: Two times a day (BID) | ORAL | Status: DC
  Administered 2020-06-13 – 2020-06-14 (×2): 500 mg via ORAL
  Filled 2020-06-13 (×3): qty 1

## 2020-06-13 MED ORDER — OXYTOCIN-SODIUM CHLORIDE 30-0.9 UT/500ML-% IV SOLN: 1.0000 m[IU]/min | INTRAVENOUS | Status: DC

## 2020-06-13 NOTE — H&P (Signed)
OB History & Physical   History of Present Illness:  Chief Complaint:   HPI:  Chelbi Herber is a 30 y.o. G1P0 female at [redacted]w[redacted]d dated by an LMP of 09/15/2019.  She will present to L&D for IOL for GDM diet controlled and contractions  She reports:  -active fetal movement -no leakage of fluid -no vaginal bleeding -onset of contractions around 1600 currently every 4-5 minutes  Pregnancy Issues: 1. Rubella: Non immune  2. Gestational diabetes    Maternal Medical History:   Past Medical History:  Diagnosis Date  . ADHD   . Family history of factor V Leiden mutation    pt has not been tested at this time  . GERD (gastroesophageal reflux disease)   . Gestational diabetes     Past Surgical History:  Procedure Laterality Date  . DILATION AND EVACUATION N/A 04/08/2019   Procedure: DILATATION AND EVACUATION;  Surgeon: Maxie Better, MD;  Location: [facility] [place];  Service: Gynecology;  Laterality: N/A;  . OPERATIVE ULTRASOUND N/A 04/08/2019   Procedure: OPERATIVE ULTRASOUND;  Surgeon: Maxie Better, MD;  Location: [facility] [place];  Service: Gynecology;  Laterality: N/A;  . WISDOM TOOTH EXTRACTION      No Known Allergies  Prior to Admission medications   Medication Sig Start Date End Date Taking? Authorizing Provider  amphetamine-dextroamphetamine (ADDERALL) 30 MG tablet Take 30 mg by mouth 2 (two) times daily. Takes 1/2 tab is 15 mg   Yes [provider]  dicyclomine (BENTYL) 10 MG capsule Take 1 capsule by mouth 3 (three) times daily. 10/21/19  Yes [provider]  famotidine (PEPCID) 20 MG tablet Take 20 mg by mouth 2 (two) times daily.   Yes [provider]  Prenatal 28-0.8 MG TABS Take 1 tablet by mouth daily.   Yes [provider]  loratadine (CLARITIN) 10 MG tablet Take 10 mg by mouth daily. [facility] loratadine  05/13/20  [provider]     Prenatal care site: [facility]   Social  History: She  reports that she has never smoked. She has never used smokeless tobacco. She reports previous alcohol use. She reports previous drug use. Drug: Marijuana.  Family History: family history includes Diabetes in her maternal grandmother; Factor V Leiden deficiency in her father; Healthy in her mother.   Review of Systems: A full review of systems was performed and negative except as noted in the HPI.    Physical Exam:  Vital Signs: BP 129/89 (BP Location: Right Arm)   Pulse 87   Resp 16   LMP 09/15/2019 (Exact Date)   General:   alert and cooperative  Skin:  normal  Neurologic:    Alert & oriented x 3  Lungs:   nl effort  Heart:   regular rate and rhythm  Abdomen:  gravid, soft between UCs  Extremities: : non-tender, symmetric    EFW: 05/15/20:EFW= 5lb15oz(2704g)= 55%  Results for orders placed or performed during the hospital encounter of 06/13/20 (from the past 24 hour(s))  Wet prep, genital     Status: Abnormal   Collection Time: 06/13/20  7:27 PM  Result Value Ref Range   Yeast Wet Prep HPF POC PRESENT (A) NONE SEEN   Trich, Wet Prep NONE SEEN NONE SEEN   Clue Cells Wet Prep HPF POC PRESENT (A) NONE SEEN   WBC, Wet Prep HPF POC FEW (A) NONE SEEN   Sperm NONE SEEN     Pertinent Results:  Prenatal Labs: Blood type/Rh  B pos  Antibody screen neg  Rubella Non-Immune  Varicella Immune  RPR NR  HBsAg Neg  HIV NR  GC neg  Chlamydia neg  Genetic screening negative  1 hour GTT 138  3 hour GTT   GBS Neg   FHT: FHR: 145 bpm, variability: moderate,  accelerations:  Present,  decelerations:  Absent Category/reactivity:  Category I TOCO: regular, every 2-3 minutes SVE: Dilation: Closed / Effacement (%): Thick /      Cephalic by SVE   Assessment:  Casondra Gasca is a 30 y.o. G1P0 female at [redacted]w[redacted]d for IOL for GDM diet controlled and contractions.   Plan:  1. Admit to Labor & Delivery; consents reviewed and obtained  2. Fetal Well being  - Fetal Tracing:  Cat I - GBS neg - Presentation: vtx confirmed by SVE   3. Routine OB: - Prenatal labs reviewed, as above - Rh pos - CBC & T&S on admit - Clear fluids, IVF  4. Induction of Labor -  Contractions external toco in place -  Plan for induction with Pitocin -  Plan for continuous fetal monitoring  -  Maternal pain control as desired: IVPM, nitrous, regional anesthesia - Anticipate vaginal delivery  5. Post Partum Planning: - Infant feeding: Breast - Contraception: TBD, considering OCPs - Flu: Given 03/21/20 - Tdap: not given  Amaira Safley, CNM 06/13/2020 9:03 PM

## 2020-06-13 NOTE — Discharge Summary (Incomplete)
Patient ID: Monique [name] MRN: [id number] DOB/AGE: Jun 19, 1990 30 y.o.  Admit date: 06/13/2020 Discharge date: 06/13/2020  Admission Diagnoses: Equivocal NST in the office 1. Rubella: Non immune  2. Gestational diabetes   Discharge Diagnoses: ***  Prenatal Procedures: NST  Consults: None  Significant Diagnostic Studies:  No results found for this or any previous visit (from the past 168 hour(s)).  Treatments: none  Hospital Course:  This is a 30 y.o. G1P0 with IUP at [redacted]w[redacted]d seen for equivocal NST in the office in the presence of GDM diet controlled.  Noted to have a cervical exam of 1/60/-3 in the office.    NST: FHR baseline: 135 bpm Variability: moderate Accelerations: yes Decelerations: none Time: 60+ minutes Category/reactivity: reactive Amniotic Fluid Index: {obgyn amniotic fluid index:312981} Biophysical Profile: {obgyn fetal surveillance:312812} Fetal evaluation comments: ***  , .  No leaking of fluid and no bleeding.  She was initially started on magnesium sulfate for tocolysis and neuroprotection and also received betamethasone x 2 doses.  Her tocolysis was transitioned to Procardia. She was seen by Neonatology during her stay.  She was observed, fetal heart rate monitoring remained reassuring, and she had no signs/symptoms of progressing preterm labor or other maternal-fetal concerns.  Her cervical exam was unchanged from admission.  She was deemed stable for discharge to home with outpatient follow up.  Discharge Physical Exam:  Resp 16   LMP 09/15/2019 (Exact Date)   General: NAD CV: RRR Pulm: CTABL, nl effort ABD: s/nd/nt, gravid DVT Evaluation: LE non-ttp, no evidence of DVT on exam.    TOCO: occasional, mild SVE: deferred      Discharge Condition: Stable  Disposition:    Allergies as of 06/13/2020   No Known Allergies   Med Rec must be completed prior to using this [facility]***        Signed:  Haroldine Laws, CNM 06/13/2020 4:40  PM

## 2020-06-14 ENCOUNTER — Encounter
Admission: EM | Disposition: A | Discharge status: Home / Self Care | Payer: Self-pay | Source: Home / Self Care | Attending: Obstetrics and Gynecology

## 2020-06-14 DIAGNOSIS — Z3A39 39 weeks gestation of pregnancy: Secondary | ICD-10-CM

## 2020-06-14 DIAGNOSIS — O24410 Gestational diabetes mellitus in pregnancy, diet controlled: Secondary | ICD-10-CM

## 2020-06-14 LAB — ABO/RH: ABO/RH(D): B POS

## 2020-06-14 LAB — RPR: RPR Ser Ql: NONREACTIVE

## 2020-06-14 SURGERY — Surgical Case
Anesthesia: Spinal

## 2020-06-14 MED ORDER — LIDOCAINE-EPINEPHRINE (PF) 1.5 %-1:200000 IJ SOLN
INTRAMUSCULAR | Status: DC | PRN
  Administered 2020-06-14: 3 mL via EPIDURAL

## 2020-06-14 MED ORDER — FENTANYL 2.5 MCG/ML W/ROPIVACAINE 0.15% IN NS 100 ML EPIDURAL (ARMC)
EPIDURAL | Status: DC | PRN
  Administered 2020-06-14: 12 mL/h via EPIDURAL

## 2020-06-14 MED ORDER — LIDOCAINE HCL (PF) 1 % IJ SOLN
INTRAMUSCULAR | Status: DC | PRN
  Administered 2020-06-14: 2 mL via SUBCUTANEOUS

## 2020-06-14 MED ORDER — EPHEDRINE 5 MG/ML INJ: 10.0000 mg | INTRAVENOUS | Status: DC | PRN

## 2020-06-14 MED ORDER — METHYLERGONOVINE MALEATE 0.2 MG/ML IJ SOLN
INTRAMUSCULAR | Status: AC
  Filled 2020-06-14: qty 1

## 2020-06-14 MED ORDER — DEXAMETHASONE SODIUM PHOSPHATE 10 MG/ML IJ SOLN
INTRAMUSCULAR | Status: DC | PRN
  Administered 2020-06-14: 10 mg via INTRAVENOUS

## 2020-06-14 MED ORDER — SOD CITRATE-CITRIC ACID 500-334 MG/5ML PO SOLN
30.0000 mL | ORAL | Status: AC
  Administered 2020-06-14: 30 mL via ORAL

## 2020-06-14 MED ORDER — MORPHINE SULFATE (PF) 0.5 MG/ML IJ SOLN
INTRAMUSCULAR | Status: AC
  Filled 2020-06-14: qty 10

## 2020-06-14 MED ORDER — SODIUM CHLORIDE (PF) 0.9 % IJ SOLN
INTRAMUSCULAR | Status: AC
  Filled 2020-06-14: qty 50

## 2020-06-14 MED ORDER — METHYLERGONOVINE MALEATE 0.2 MG/ML IJ SOLN
INTRAMUSCULAR | Status: DC | PRN
  Administered 2020-06-14: .2 mg via INTRAMUSCULAR

## 2020-06-14 MED ORDER — SODIUM CHLORIDE 0.9 % IV SOLN: 500.0000 mg | INTRAVENOUS | Status: DC

## 2020-06-14 MED ORDER — PHENYLEPHRINE HCL (PRESSORS) 10 MG/ML IV SOLN
INTRAVENOUS | Status: DC | PRN
  Administered 2020-06-14 (×2): 100 ug via INTRAVENOUS

## 2020-06-14 MED ORDER — CEFAZOLIN SODIUM-DEXTROSE 2-3 GM-%(50ML) IV SOLR
INTRAVENOUS | Status: DC | PRN
  Administered 2020-06-14: 2 g via INTRAVENOUS

## 2020-06-14 MED ORDER — ONDANSETRON HCL 4 MG/2ML IJ SOLN
INTRAMUSCULAR | Status: DC | PRN
  Administered 2020-06-14: 4 mg via INTRAVENOUS

## 2020-06-14 MED ORDER — PHENYLEPHRINE 40 MCG/ML (10ML) SYRINGE FOR IV PUSH (FOR BLOOD PRESSURE SUPPORT): 80.0000 ug | PREFILLED_SYRINGE | INTRAVENOUS | Status: DC | PRN

## 2020-06-14 MED ORDER — BUPIVACAINE LIPOSOME 1.3 % IJ SUSP
INTRAMUSCULAR | Status: AC
  Filled 2020-06-14: qty 20

## 2020-06-14 MED ORDER — FENTANYL 2.5 MCG/ML W/ROPIVACAINE 0.15% IN NS 100 ML EPIDURAL (ARMC)
12.0000 mL/h | EPIDURAL | Status: DC
  Administered 2020-06-14 (×3): 12 mL/h via EPIDURAL
  Filled 2020-06-14 (×3): qty 100

## 2020-06-14 MED ORDER — DEXAMETHASONE SODIUM PHOSPHATE 10 MG/ML IJ SOLN
INTRAMUSCULAR | Status: AC
  Filled 2020-06-14: qty 1

## 2020-06-14 MED ORDER — LACTATED RINGERS IV SOLN: 500.0000 mL | Freq: Once | INTRAVENOUS | Status: DC

## 2020-06-14 MED ORDER — SODIUM CHLORIDE 0.9 % IV SOLN
INTRAVENOUS | Status: DC | PRN
  Administered 2020-06-14: 50 ug/min via INTRAVENOUS

## 2020-06-14 MED ORDER — CEFAZOLIN SODIUM-DEXTROSE 2-4 GM/100ML-% IV SOLN: 2.0000 g | INTRAVENOUS | Status: DC

## 2020-06-14 MED ORDER — BUPIVACAINE HCL (PF) 0.5 % IJ SOLN
INTRAMUSCULAR | Status: AC
  Filled 2020-06-14: qty 30

## 2020-06-14 MED ORDER — ONDANSETRON HCL 4 MG/2ML IJ SOLN
INTRAMUSCULAR | Status: AC
  Filled 2020-06-14: qty 2

## 2020-06-14 MED ORDER — FENTANYL CITRATE (PF) 100 MCG/2ML IJ SOLN
INTRAMUSCULAR | Status: AC
  Filled 2020-06-14: qty 2

## 2020-06-14 MED ORDER — MORPHINE SULFATE (PF) 0.5 MG/ML IJ SOLN
INTRAMUSCULAR | Status: DC | PRN
  Administered 2020-06-14: .1 mg via INTRATHECAL

## 2020-06-14 MED ORDER — SODIUM CHLORIDE 0.9 % IV SOLN
INTRAVENOUS | Status: DC | PRN
  Administered 2020-06-14 (×2): 5 mL via EPIDURAL

## 2020-06-14 MED ORDER — FENTANYL CITRATE (PF) 100 MCG/2ML IJ SOLN
INTRAMUSCULAR | Status: DC | PRN
  Administered 2020-06-14: 15 ug via INTRATHECAL

## 2020-06-14 MED ORDER — DIPHENHYDRAMINE HCL 50 MG/ML IJ SOLN
12.5000 mg | INTRAMUSCULAR | Status: DC | PRN
Start: 2020-06-14 — End: 2020-06-15

## 2020-06-14 MED ORDER — BUPIVACAINE LIPOSOME 1.3 % IJ SUSP: 20.0000 mL | Freq: Once | INTRAMUSCULAR | Status: DC

## 2020-06-14 MED ORDER — BUPIVACAINE IN DEXTROSE 0.75-8.25 % IT SOLN
INTRATHECAL | Status: DC | PRN
  Administered 2020-06-14: 1.5 mL via INTRATHECAL

## 2020-06-14 SURGICAL SUPPLY — 30 items
CANISTER SUCT 3000ML PPV (MISCELLANEOUS) ×2 IMPLANT
CHLORAPREP W/TINT 26 (MISCELLANEOUS) ×2 IMPLANT
COVER WAND RF STERILE (DRAPES) ×2 IMPLANT
DRSG TELFA 3X8 NADH (GAUZE/BANDAGES/DRESSINGS) ×4 IMPLANT
ELECT REM PT RETURN 9FT ADLT (ELECTROSURGICAL) ×4
ELECTRODE REM PT RTRN 9FT ADLT (ELECTROSURGICAL) ×2 IMPLANT
GAUZE SPONGE 4X4 12PLY STRL (GAUZE/BANDAGES/DRESSINGS) ×4 IMPLANT
GOWN STRL REUS W/ TWL LRG LVL3 (GOWN DISPOSABLE) ×3 IMPLANT
GOWN STRL REUS W/TWL LRG LVL3 (GOWN DISPOSABLE) ×3
MANIFOLD NEPTUNE II (INSTRUMENTS) ×2 IMPLANT
MAT PREVALON FULL STRYKER (MISCELLANEOUS) ×2 IMPLANT
NEEDLE HYPO 22GX1.5 SAFETY (NEEDLE) ×2 IMPLANT
NEEDLE HYPO 25GX1X1/2 BEV (NEEDLE) ×2 IMPLANT
NS IRRIG 1000ML POUR BTL (IV SOLUTION) ×2 IMPLANT
PACK C SECTION AR (MISCELLANEOUS) ×2 IMPLANT
PAD OB MATERNITY 4.3X12.25 (PERSONAL CARE ITEMS) ×2 IMPLANT
PAD PREP 24X41 OB/GYN DISP (PERSONAL CARE ITEMS) ×2 IMPLANT
PENCIL SMOKE EVACUATOR (MISCELLANEOUS) ×2 IMPLANT
SLEEVE PROTECTION STRL DISP (MISCELLANEOUS) ×2 IMPLANT
STAPLER INSORB 30 2030 C-SECTI (MISCELLANEOUS) ×2 IMPLANT
SUT MNCRL 4-0 (SUTURE) ×1
SUT MNCRL 4-0 27XMFL (SUTURE) ×1
SUT PDS AB 1 TP1 96 (SUTURE) ×2 IMPLANT
SUT VIC AB 0 CT1 36 (SUTURE) ×6 IMPLANT
SUT VIC AB 0 CTX 36 (SUTURE) ×3
SUT VIC AB 0 CTX36XBRD ANBCTRL (SUTURE) ×3 IMPLANT
SUT VIC AB 2-0 SH 27 (SUTURE) ×2
SUT VIC AB 2-0 SH 27XBRD (SUTURE) ×2 IMPLANT
SUTURE MNCRL 4-0 27XMF (SUTURE) ×1 IMPLANT
SYR 30ML LL (SYRINGE) ×8 IMPLANT

## 2020-06-14 NOTE — Progress Notes (Signed)
Labor Progress Note  Monique [name] is a 30 y.o. G1P0 at [redacted]w[redacted]d by LMP admitted for induction of labor due to Gestational diabetes.  Subjective: Pt is comfortable with epidural  Objective: BP 129/89 (BP Location: Right Arm)   Pulse 87   Temp 98 F (36.7 C)   Resp 16   Ht 5\' 4"  (1.626 m)   Wt 90.3 kg   LMP 09/15/2019 (Exact Date)   SpO2 100%   BMI 34.16 kg/m   Fetal Assessment: FHT:  FHR: 150 bpm, variability: moderate,  accelerations:  Present,  decelerations:  Absent Category/reactivity:  Category I UC:   regular, every 4-5 minutes SVE:   deferred Dilation:   Effacement:   Station:    Consistency:   Position:   Membrane status: Intact Amniotic color: n/a  Labs: Lab Results  Component Value Date   WBC 12.8 (H) 06/13/2020   HGB 12.1 06/13/2020   HCT 35.6 (L) 06/13/2020   MCV 86.4 06/13/2020   PLT 185 06/13/2020    Assessment / Plan: Induction of labor due to gestational diabetes,  progressing well on pitocin  Labor: Progressing on Pitocin, will continue to increase then AROM Preeclampsia:  no signs or symptoms of toxicity Fetal Wellbeing:  Category I Pain Control:  Epidural I/D:  Afebrile, GBS neg, Intact;  Current infection - Yeast and BV Anticipated MOD:  NSVD  06/15/2020, CNM 06/14/2020, 12:42 AM

## 2020-06-14 NOTE — Anesthesia Procedure Notes (Signed)
Epidural Patient location during procedure: OB Start time: 06/14/2020 12:01 AM End time: 06/14/2020 12:07 AM  Staffing Anesthesiologist: Lenard Simmer, MD Performed: anesthesiologist   Preanesthetic Checklist Completed: patient identified, IV checked, site marked, risks and benefits discussed, surgical consent, monitors and equipment checked, pre-op evaluation and timeout performed  Epidural Patient position: sitting Prep: ChloraPrep Patient monitoring: heart rate, continuous pulse ox and blood pressure Approach: midline Location: L3-L4 Injection technique: LOR saline  Needle:  Needle type: Tuohy  Needle gauge: 17 G Needle length: 9 cm and 9 Needle insertion depth: 5 cm Catheter type: closed end flexible Catheter size: 19 Gauge Catheter at skin depth: 10 cm Test dose: negative and 1.5% lidocaine with Epi 1:200 K  Assessment Sensory level: T10 Events: blood not aspirated, injection not painful, no injection resistance, no paresthesia and negative IV test  Additional Notes 1st attempt Pt. Evaluated and documentation done after procedure finished. Patient identified. Risks/Benefits/Options discussed with patient including but not limited to bleeding, infection, nerve damage, paralysis, failed block, incomplete pain control, headache, blood pressure changes, nausea, vomiting, reactions to medication both or allergic, itching and postpartum back pain. Confirmed with bedside nurse the patient's most recent platelet count. Confirmed with patient that they are not currently taking any anticoagulation, have any bleeding history or any family history of bleeding disorders. Patient expressed understanding and wished to proceed. All questions were answered. Sterile technique was used throughout the entire procedure. Please see nursing notes for vital signs. Test dose was given through epidural catheter and negative prior to continuing to dose epidural or start infusion. Warning signs of high  block given to the patient including shortness of breath, tingling/numbness in hands, complete motor block, or any concerning symptoms with instructions to call for help. Patient was given instructions on fall risk and not to get out of bed. All questions and concerns addressed with instructions to call with any issues or inadequate analgesia.   Patient tolerated the insertion well without immediate complications.Reason for block:procedure for pain

## 2020-06-14 NOTE — Progress Notes (Signed)
Labor Progress Note  Monique [name] is a 30 y.o. G1P0 at [redacted]w[redacted]d by LMP admitted for induction of labor due to A1GDM.  Subjective: remains comfortable with epidural, feeling intermittent lower abdominal pressure   Objective: BP 110/67   Pulse 80   Temp 98.4 F (36.9 C) (Oral)   Resp 16   Ht 5\' 4"  (1.626 m)   Wt 90.3 kg   LMP 09/15/2019 (Exact Date)   SpO2 100%   BMI 34.16 kg/m  Notable VS details: reviewed   Fetal Assessment: FHT:  FHR: 145 bpm, variability: moderate,  accelerations:  Present,  decelerations:  Present occasional variables Category/reactivity:  Category II UC:   regular, every 2-3 minutes, MVU 110 SVE:   8/90/0 Membrane status: AROM at 0835 Amniotic color: Clear - blood tinged   Labs: Lab Results  Component Value Date   WBC 12.8 (H) 06/13/2020   HGB 12.1 06/13/2020   HCT 35.6 (L) 06/13/2020   MCV 86.4 06/13/2020   PLT 185 06/13/2020    Assessment / Plan: Induction of labor due to A1GDM,  progressing well on pitocin  -Pitocin currently infusing at 22 milliunits/min  -MVU's not adequate  -Will continue to increase for adequate MVU's and fetal tolerance up to 30 milliunits/min -Dr. 06/15/2020 updated on labor progress and plan of care   Labor: Progressing normally Preeclampsia:  no signs or symptoms of toxicity Fetal Wellbeing:  Category II for occasional variables.  Overall reassuring with moderate variability and accelerations  Pain Control:  Epidural I/D:  GBS neg, ROM x 4 hours, afebrile Anticipated MOD:  NSVD  Dalbert Garnet, CNM 06/14/2020, 12:22 PM

## 2020-06-14 NOTE — Progress Notes (Signed)
Labor Progress Note  Monique [name] is a 30 y.o. G1P0 at [redacted]w[redacted]d by LMP admitted for A1GDM  Subjective: comfortable with epidural  Objective: BP 120/74 (BP Location: Right Arm)   Pulse 87   Temp 98.8 F (37.1 C) (Oral)   Resp 16   Ht 5\' 4"  (1.626 m)   Wt 90.3 kg   LMP 09/15/2019 (Exact Date)   SpO2 99%   BMI 34.16 kg/m  Notable VS details: reviewed   Fetal Assessment: FHT:  FHR: 150 bpm, variability: moderate,  accelerations:  Present,  decelerations:  Present occasional lates and variables Category/reactivity:  Category II UC:   regular, every 2-3 minutes - IUPC placed  SVE:   6/90/-1 Membrane status: AROM Amniotic color: Blood tinged fluid  Labs: Lab Results  Component Value Date   WBC 12.8 (H) 06/13/2020   HGB 12.1 06/13/2020   HCT 35.6 (L) 06/13/2020   MCV 86.4 06/13/2020   PLT 185 06/13/2020    Assessment / Plan: Induction of labor due to A1GDM,  progressing well on pitocin  -AROM for blood tinged fluid  -IUPC placed   Labor: Progressing normally Preeclampsia:  no signs or symptoms of toxicity Fetal Wellbeing:  Category II for occasional lates and variables. Overall reassuring with moderate variability and accels  Pain Control:  Epidural I/D:  GBS neg, afebrile Anticipated MOD:  NSVD  06/15/2020, CNM 06/14/2020, 8:45 AM

## 2020-06-14 NOTE — Anesthesia Preprocedure Evaluation (Signed)
Anesthesia Evaluation  Patient identified by MRN, date of birth, ID band Patient awake    Reviewed: Allergy & Precautions, H&P , NPO status , Patient's Chart, lab work & pertinent test results, reviewed documented beta blocker date and time   History of Anesthesia Complications Negative for: history of anesthetic complications  Airway Mallampati: I  TM Distance: >3 FB Neck ROM: full    Dental  (+) Dental Advidsory Given, Teeth Intact   Pulmonary neg pulmonary ROS,    Pulmonary exam normal breath sounds clear to auscultation       Cardiovascular Exercise Tolerance: Good negative cardio ROS Normal cardiovascular exam Rhythm:regular Rate:Normal     Neuro/Psych PSYCHIATRIC DISORDERS negative neurological ROS     GI/Hepatic Neg liver ROS, GERD  ,  Endo/Other  diabetes  Renal/GU negative Renal ROS  negative genitourinary   Musculoskeletal   Abdominal   Peds  Hematology negative hematology ROS (+)   Anesthesia Other Findings Past Medical History: No date: ADHD No date: Family history of factor V Leiden mutation     Comment:  pt has not been tested at this time No date: GERD (gastroesophageal reflux disease) No date: Gestational diabetes   Reproductive/Obstetrics (+) Pregnancy                             Anesthesia Physical Anesthesia Plan  ASA: II  Anesthesia Plan: Epidural   Post-op Pain Management:    Induction:   PONV Risk Score and Plan:   Airway Management Planned:   Additional Equipment:   Intra-op Plan:   Post-operative Plan:   Informed Consent: I have reviewed the patients History and Physical, chart, labs and discussed the procedure including the risks, benefits and alternatives for the proposed anesthesia with the patient or authorized representative who has indicated his/her understanding and acceptance.     Dental Advisory Given  Plan Discussed with:  Anesthesiologist, CRNA and Surgeon  Anesthesia Plan Comments:         Anesthesia Quick Evaluation

## 2020-06-14 NOTE — Progress Notes (Signed)
30yo G1P0 at [redacted]w[redacted]d who is anterior lip since 1634, without further descent. Cat I strip.   The risks of cesarean section discussed with the patient included but were not limited to: bleeding which may require transfusion or reoperation; infection which may require antibiotics; injury to bowel, bladder, ureters or other surrounding organs; injury to the fetus; need for additional procedures including hysterectomy in the event of a life-threatening hemorrhage; placental abnormalities wth subsequent pregnancies, incisional problems, thromboembolic phenomenon and other postoperative/anesthesia complications. The patient concurred with the proposed plan, giving informed written consent for the procedure.   Patient has been NPO since yesterday and she will remain NPO for procedure. Anesthesia and OR aware. Preoperative prophylactic antibiotics and SCDs ordered on call to the OR.  To OR when ready.

## 2020-06-14 NOTE — Progress Notes (Signed)
Labor Progress Note  Monique [name] is a 30 y.o. G1P0 at [redacted]w[redacted]d by LMP admitted for induction of labor due to A1GDM.  Subjective: feeling intermittent pressure   Objective: BP 120/82   Pulse 85   Temp 98.5 F (36.9 C) (Oral)   Resp 16   Ht 5\' 4"  (1.626 m)   Wt 90.3 kg   LMP 09/15/2019 (Exact Date)   SpO2 100%   BMI 34.16 kg/m  Notable VS details: reviewed   Fetal Assessment: FHT:  FHR: 145 bpm, variability: moderate,  accelerations:  Present,  decelerations:  Present Occasional variables Category/reactivity:  Category II UC:   regular, every 2-3 minutes SVE:   9/90/+1 - caput and molding present  Membrane status: AROM at 0835 Amniotic color: clear   Labs: Lab Results  Component Value Date   WBC 12.8 (H) 06/13/2020   HGB 12.1 06/13/2020   HCT 35.6 (L) 06/13/2020   MCV 86.4 06/13/2020   PLT 185 06/13/2020    Assessment / Plan: Induction of labor due to A1GDM,  progressing well on pitocin  -Molding and caput felt, caput at +2 station -fetal head making good descent in the pelvis  -has made progressive cervical change with descent during labor  -Dr. 06/15/2020 updated on labor progress plan of care   Labor: Progressing normally Preeclampsia:  no signs or symptoms of toxicity Fetal Wellbeing:  Category II Pain Control:  Epidural I/D:  GBS neg, TMax 99.8, ROM x 9 hours Anticipated MOD:  NSVD  Dalbert Garnet, CNM 06/14/2020, 4:59 PM

## 2020-06-14 NOTE — Progress Notes (Signed)
Monique [name] is a 30 y.o. G1P0 at [redacted]w[redacted]d, now anterior lip. 9cm recorded 4 hours ago. Cat I strip. No evidence of infection  Subjective: Comfortable with epidural  Objective: BP 131/78   Pulse 61   Temp 98.3 F (36.8 C) (Oral)   Resp 18   Ht 5\' 4"  (1.626 m)   Wt 90.3 kg   LMP 09/15/2019 (Exact Date)   SpO2 100%   BMI 34.16 kg/m  I/O last 3 completed shifts: In: 2642.5 [I.V.:2572.9; Other:69.6] Out: 2450 [Urine:2450] No intake/output data recorded.  FHT:  FHR: 135 bpm, variability: moderate,  accelerations:  Present,  decelerations:  Absent UC:   q2-3 min, on 26 of pit SVE:   Dilation: Lip/rim Effacement (%): 90 Station: 0 (caput plus 1) Exam by:: Beasey MD  Labs: Lab Results  Component Value Date   WBC 12.8 (H) 06/13/2020   HGB 12.1 06/13/2020   HCT 35.6 (L) 06/13/2020   MCV 86.4 06/13/2020   PLT 185 06/13/2020    Assessment / Plan:  After discussion with patient and family, in presence of Cat I strip, will give 1 hour for ant lip to move and 1 hour of pushing. If unchanged in 2 hours will move to surgical delivery for arrest of descent.  06/15/2020 06/14/2020, 8:00 PM

## 2020-06-15 ENCOUNTER — Encounter: Payer: Self-pay | Admitting: Obstetrics and Gynecology

## 2020-06-15 LAB — CBC
HCT: 32.5 % — ABNORMAL LOW (ref 36.0–46.0)
Hemoglobin: 11.1 g/dL — ABNORMAL LOW (ref 12.0–15.0)
MCH: 29.4 pg (ref 26.0–34.0)
MCHC: 34.2 g/dL (ref 30.0–36.0)
MCV: 86 fL (ref 80.0–100.0)
Platelets: 173 10*3/uL (ref 150–400)
RBC: 3.78 MIL/uL — ABNORMAL LOW (ref 3.87–5.11)
RDW: 13.2 % (ref 11.5–15.5)
WBC: 32.6 10*3/uL — ABNORMAL HIGH (ref 4.0–10.5)
nRBC: 0 % (ref 0.0–0.2)

## 2020-06-15 LAB — GLUCOSE, CAPILLARY: Glucose-Capillary: 114 mg/dL — ABNORMAL HIGH (ref 70–99)

## 2020-06-15 MED ORDER — OXYCODONE HCL 5 MG PO TABS
5.0000 mg | ORAL_TABLET | ORAL | Status: DC | PRN
  Administered 2020-06-15 – 2020-06-16 (×5): 10 mg via ORAL
  Filled 2020-06-15 (×5): qty 2

## 2020-06-15 MED ORDER — SODIUM CHLORIDE 0.9% FLUSH: 3.0000 mL | INTRAVENOUS | Status: DC | PRN

## 2020-06-15 MED ORDER — KETOROLAC TROMETHAMINE 30 MG/ML IJ SOLN
30.0000 mg | Freq: Four times a day (QID) | INTRAMUSCULAR | Status: DC
  Administered 2020-06-15: 30 mg via INTRAVENOUS
  Filled 2020-06-15: qty 1

## 2020-06-15 MED ORDER — ONDANSETRON HCL 4 MG/2ML IJ SOLN: 4.0000 mg | Freq: Three times a day (TID) | INTRAMUSCULAR | Status: DC | PRN

## 2020-06-15 MED ORDER — PRENATAL MULTIVITAMIN CH
1.0000 | ORAL_TABLET | Freq: Every day | ORAL | Status: DC
  Filled 2020-06-15: qty 1

## 2020-06-15 MED ORDER — FAMOTIDINE 20 MG PO TABS
20.0000 mg | ORAL_TABLET | Freq: Two times a day (BID) | ORAL | Status: DC
  Administered 2020-06-15: 20 mg via ORAL
  Filled 2020-06-15 (×3): qty 1

## 2020-06-15 MED ORDER — SIMETHICONE 80 MG PO CHEW
80.0000 mg | CHEWABLE_TABLET | Freq: Three times a day (TID) | ORAL | Status: DC
  Administered 2020-06-15 (×3): 80 mg via ORAL
  Filled 2020-06-15 (×4): qty 1

## 2020-06-15 MED ORDER — GABAPENTIN 300 MG PO CAPS
300.0000 mg | ORAL_CAPSULE | Freq: Every day | ORAL | Status: DC
  Filled 2020-06-15: qty 1

## 2020-06-15 MED ORDER — OXYTOCIN-SODIUM CHLORIDE 30-0.9 UT/500ML-% IV SOLN: 2.5000 [IU]/h | INTRAVENOUS | Status: DC

## 2020-06-15 MED ORDER — COCONUT OIL OIL: 1.0000 "application " | TOPICAL_OIL | Status: DC | PRN

## 2020-06-15 MED ORDER — FERROUS SULFATE 325 (65 FE) MG PO TABS
325.0000 mg | ORAL_TABLET | Freq: Two times a day (BID) | ORAL | Status: DC
  Administered 2020-06-15 – 2020-06-16 (×4): 325 mg via ORAL
  Filled 2020-06-15 (×4): qty 1

## 2020-06-15 MED ORDER — NALBUPHINE HCL 10 MG/ML IJ SOLN
5.0000 mg | INTRAMUSCULAR | Status: DC | PRN
Start: 2020-06-15 — End: 2020-06-15

## 2020-06-15 MED ORDER — DIBUCAINE (PERIANAL) 1 % EX OINT
1.0000 | TOPICAL_OINTMENT | CUTANEOUS | Status: DC | PRN
Start: 2020-06-15 — End: 2020-06-17

## 2020-06-15 MED ORDER — KETOROLAC TROMETHAMINE 30 MG/ML IJ SOLN
30.0000 mg | Freq: Four times a day (QID) | INTRAMUSCULAR | Status: AC | PRN
Start: 2020-06-15 — End: 2020-06-16

## 2020-06-15 MED ORDER — DIPHENHYDRAMINE HCL 25 MG PO CAPS: 25.0000 mg | ORAL_CAPSULE | Freq: Four times a day (QID) | ORAL | Status: DC | PRN

## 2020-06-15 MED ORDER — ACETAMINOPHEN 500 MG PO TABS: 1000.0000 mg | ORAL_TABLET | Freq: Four times a day (QID) | ORAL | Status: DC

## 2020-06-15 MED ORDER — LACTATED RINGERS IV BOLUS
500.0000 mL | Freq: Once | INTRAVENOUS | Status: AC
  Administered 2020-06-15: 500 mL via INTRAVENOUS

## 2020-06-15 MED ORDER — ACETAMINOPHEN 500 MG PO TABS
1000.0000 mg | ORAL_TABLET | Freq: Four times a day (QID) | ORAL | Status: DC
  Administered 2020-06-15 – 2020-06-16 (×3): 1000 mg via ORAL
  Filled 2020-06-15 (×4): qty 2

## 2020-06-15 MED ORDER — OXYTOCIN-SODIUM CHLORIDE 30-0.9 UT/500ML-% IV SOLN
INTRAVENOUS | Status: AC
  Filled 2020-06-15: qty 500

## 2020-06-15 MED ORDER — MEASLES, MUMPS & RUBELLA VAC IJ SOLR
0.5000 mL | Freq: Once | INTRAMUSCULAR | Status: DC
  Filled 2020-06-15: qty 0.5

## 2020-06-15 MED ORDER — NALOXONE HCL 0.4 MG/ML IJ SOLN: 0.4000 mg | INTRAMUSCULAR | Status: DC | PRN

## 2020-06-15 MED ORDER — KETOROLAC TROMETHAMINE 30 MG/ML IJ SOLN
30.0000 mg | Freq: Four times a day (QID) | INTRAMUSCULAR | Status: AC
  Administered 2020-06-15 (×2): 30 mg via INTRAVENOUS
  Filled 2020-06-15 (×2): qty 1

## 2020-06-15 MED ORDER — MENTHOL 3 MG MT LOZG
1.0000 | LOZENGE | OROMUCOSAL | Status: DC | PRN
  Filled 2020-06-15: qty 9

## 2020-06-15 MED ORDER — IBUPROFEN 800 MG PO TABS
800.0000 mg | ORAL_TABLET | Freq: Four times a day (QID) | ORAL | Status: DC
  Administered 2020-06-16: 800 mg via ORAL
  Filled 2020-06-15 (×2): qty 1

## 2020-06-15 MED ORDER — DICYCLOMINE HCL 10 MG PO CAPS
10.0000 mg | ORAL_CAPSULE | Freq: Three times a day (TID) | ORAL | Status: DC
  Administered 2020-06-15 (×2): 10 mg via ORAL
  Filled 2020-06-15 (×6): qty 1

## 2020-06-15 MED ORDER — NALOXONE HCL 4 MG/10ML IJ SOLN
1.0000 ug/kg/h | INTRAVENOUS | Status: DC | PRN
  Filled 2020-06-15: qty 5

## 2020-06-15 MED ORDER — DIPHENHYDRAMINE HCL 50 MG/ML IJ SOLN: 12.5000 mg | INTRAMUSCULAR | Status: DC | PRN

## 2020-06-15 MED ORDER — SODIUM CHLORIDE 0.9 % IV SOLN
INTRAVENOUS | Status: DC | PRN
  Administered 2020-06-15: 40 mL

## 2020-06-15 MED ORDER — AMPHETAMINE-DEXTROAMPHETAMINE 5 MG PO TABS
15.0000 mg | ORAL_TABLET | Freq: Two times a day (BID) | ORAL | Status: DC
  Administered 2020-06-15: 15 mg via ORAL
  Filled 2020-06-15 (×3): qty 3

## 2020-06-15 MED ORDER — SIMETHICONE 80 MG PO CHEW
80.0000 mg | CHEWABLE_TABLET | ORAL | Status: DC | PRN
Start: 2020-06-15 — End: 2020-06-17

## 2020-06-15 MED ORDER — SENNOSIDES-DOCUSATE SODIUM 8.6-50 MG PO TABS
2.0000 | ORAL_TABLET | ORAL | Status: DC
  Administered 2020-06-15 – 2020-06-16 (×2): 2 via ORAL
  Filled 2020-06-15 (×2): qty 2

## 2020-06-15 MED ORDER — DIPHENHYDRAMINE HCL 25 MG PO CAPS: 25.0000 mg | ORAL_CAPSULE | ORAL | Status: DC | PRN

## 2020-06-15 MED ORDER — LACTATED RINGERS IV SOLN: INTRAVENOUS | Status: DC

## 2020-06-15 MED ORDER — NALBUPHINE HCL 10 MG/ML IJ SOLN
5.0000 mg | Freq: Once | INTRAMUSCULAR | Status: DC | PRN
Start: 2020-06-15 — End: 2020-06-15

## 2020-06-15 MED ORDER — NALBUPHINE HCL 10 MG/ML IJ SOLN: 5.0000 mg | Freq: Once | INTRAMUSCULAR | Status: DC | PRN

## 2020-06-15 MED ORDER — KETOROLAC TROMETHAMINE 30 MG/ML IJ SOLN: 30.0000 mg | Freq: Four times a day (QID) | INTRAMUSCULAR | Status: AC | PRN

## 2020-06-15 MED ORDER — IBUPROFEN 800 MG PO TABS: 800.0000 mg | ORAL_TABLET | Freq: Four times a day (QID) | ORAL | Status: DC

## 2020-06-15 MED ORDER — FLEET ENEMA 7-19 GM/118ML RE ENEM
1.0000 | ENEMA | Freq: Every day | RECTAL | Status: DC | PRN
Start: 2020-06-15 — End: 2020-06-17

## 2020-06-15 MED ORDER — TETANUS-DIPHTH-ACELL PERTUSSIS 5-2.5-18.5 LF-MCG/0.5 IM SUSY: 0.5000 mL | PREFILLED_SYRINGE | Freq: Once | INTRAMUSCULAR | Status: DC

## 2020-06-15 MED ORDER — BISACODYL 10 MG RE SUPP: 10.0000 mg | Freq: Every day | RECTAL | Status: DC | PRN

## 2020-06-15 MED ORDER — ACETAMINOPHEN 500 MG PO TABS
1000.0000 mg | ORAL_TABLET | Freq: Four times a day (QID) | ORAL | Status: DC
  Administered 2020-06-15: 1000 mg via ORAL
  Filled 2020-06-15: qty 2

## 2020-06-15 MED ORDER — BUPIVACAINE HCL (PF) 0.25 % IJ SOLN
INTRAMUSCULAR | Status: DC | PRN
  Administered 2020-06-15: 20 mL

## 2020-06-15 MED ORDER — WITCH HAZEL-GLYCERIN EX PADS: 1.0000 "application " | MEDICATED_PAD | CUTANEOUS | Status: DC | PRN

## 2020-06-15 NOTE — Lactation Note (Signed)
This note was copied from a baby's chart. Lactation Consultation Note  Patient Name: Monique [name] AXENM'M Date: 06/15/2020 Reason for consult: Initial assessment;Primapara;Term Age:31 hours  Maternal Data Has patient been taught Hand Expression?: Yes Does the patient have breastfeeding experience prior to this delivery?: No  Feeding Mother's Current Feeding Choice: Breast Milk  LATCH Score Latch: Grasps breast easily, tongue down, lips flanged, rhythmical sucking.  Audible Swallowing: Spontaneous and intermittent  Type of Nipple: Everted at rest and after stimulation  Comfort (Breast/Nipple): Soft / non-tender  Hold (Positioning): Assistance needed to correctly position infant at breast and maintain latch.  LATCH Score: 9   Lactation Tools Discussed/Used    Interventions Interventions: Breast feeding basics reviewed;Assisted with latch;Skin to skin;Hand express;Breast massage;Breast compression;Adjust position;Support pillows  Discharge Discharge Education: Engorgement and breast care Pump: Personal WIC Program: No  Consult Status Consult Status: PRN    Dyann Kief 06/15/2020, 3:16 PM

## 2020-06-15 NOTE — Progress Notes (Signed)
Pt's output has improved; RN in room to remove foley; pt refused to have RN remove foley; pt informed of increase risk for UTI and still refused saying "I just want to be left alone, i'm tired"; after pt has a nap and feeds the baby again then she is to call for RN so baby can have 24 hour screens done and will re-address foley coming out

## 2020-06-15 NOTE — Progress Notes (Signed)
Post Partum/POD 1  Subjective: Doing well, no complaints.  Tolerating regular diet, pain with PO meds, ambulating without difficulty. Foley remains in place- has been adequate but low urine output, dark urine. Now s/p IV fluid bolus and improving urine clarity and volume.   No CP SOB Fever,Chills, N/V or leg pain; denies nipple or breast pain; no HA change of vision, RUQ/epigastric pain  Objective: BP 120/77 (BP Location: Left Arm)   Pulse 82   Temp 98 F (36.7 C) (Oral)   Resp 18   Ht _0  (1.626 m)   Wt 90.3 kg   LMP 09/15/2019 (Exact Date)   SpO2 98%   Breastfeeding Unknown   BMI 34.16 kg/m    Physical Exam:  General: NAD Breasts: soft/nontender CV: RRR Pulm: nl effort, CTABL Abdomen: soft, NT, BS x 4 Incision: Pressure Dsg CDI Lochia: small Uterine Fundus: fundus firm and 1 fb below umbilicus DVT Evaluation: no cords, ttp LEs   Recent Labs    06/13/20 2210 06/15/20 0602  HGB 12.1 11.1*  HCT 35.6* 32.5*  WBC 12.8* 32.6*  PLT 185 173    Assessment/Plan: 30 y.o. G1P1001 postpartum day # 1  - Continue routine PP care- encourage PO intake, ambulation. Plan to remove foley cath.  - Lactation consult prn - Acute blood loss anemia - s/p PP hemorrhage with approx 2464m QBL. hemodynamically stable and asymptomatic; continue po ferrous sulfate BID with stool softeners  - repeat CBC in am. Leukocytosis noted, afebrile.  - Immunization status: Needs  MMR prior to DC   Disposition: Does not desire Dc home today.     RFrancetta Found CNM 06/15/2020  5:22 PM

## 2020-06-15 NOTE — Op Note (Addendum)
Cesarean Section Procedure Note  Date of procedure: 06/14/2020   Pre-operative Diagnosis: Intrauterine pregnancy at [redacted]w[redacted]d;  - arrest of descent  Post-operative Diagnosis: same, delivered.   Procedure: Low Transverse Cesarean Section through Pfannenstiel incision  Surgeon: Christeen Douglas, MD  Assistant(s):  Annamarie Major, MD. Because of the length of labor and second stage, the risk of tissue destruction and catastrophic bleeding, a skilled second surgeon was required for this case.  Anesthesia: Spinal anesthesia  Anesthesiologist: No responsible provider has been recorded for the case. Anesthesiologist: Yevette Edwards, MD; Lenard Simmer, MD; Piscitello, Cleda Mccreedy, MD CRNA: Mathews Argyle, CRNA  Estimated Blood Loss:  2235         Drains: FOLEY         Total IV Fluids:  Urine Output:         Specimens: none         Complications:  Intrapartum hemorrhage with uterine atony.          Disposition: PACU - hemodynamically stable.         Condition: stable  Findings:  A female infant "Addilyn" in cephalic OP presentation. Amniotic fluid - Clear  Birth weight 8#6oz.  Apgars of 8 and 9 at one and five minutes respectively.  Intact placenta with a three-vessel cord.  Grossly normal uterus, tubes and ovaries bilaterally. No intraabdominal adhesions were noted.  Indications: failed induction and failure to progress: arrest of descent  Procedure Details  The patient was taken to Operating Room, identified as the correct patient and the procedure verified as C-Section Delivery. A formal Time Out was held with all team members present and in agreement.  After induction of anesthesia, the patient was draped and prepped in the usual sterile manner. A Pfannenstiel skin incision was made and carried down through the subcutaneous tissue to the fascia. Fascial incision was made and extended transversely with the Mayo scissors. The fascia was separated from the underlying  rectus tissue superiorly and inferiorly. The peritoneum was identified and entered bluntly. Peritoneal incision was extended longitudinally. The utero-vesical peritoneal reflection was incised transversely and a bladder flap was created digitally.   A low transverse hysterotomy was made. The fetus was delivered atraumatically. The umbilical cord was clamped x2 and cut and the infant was handed to the awaiting pediatricians. The placenta was removed intact and appeared normal, intact, and with a 3-vessel cord.   The uterus was exteriorized and cleared of all clot and debris. The hysterotomy was closed with running sutures of 0-Vicryl. A second imbricating layer was placed with the same suture. Bleeding was noted at the angles, and small bilateral vaginal extensions were noted. Several hemostatic sutures were placed and a third imbricating layer was completed to control bleeding. IM methergine was given for continued uterine bogginess. After these measures, uterine hemostasis was observed. The peritoneal cavity was cleared of all clots and debris. The uterus was returned to the abdomen.   Slow survey of the hysterotomy off tension was performed and excellent hemostasis was noted. The fascia was then reapproximated with running sutures of 0 PDS. Two hemostatic sutures were placed at the fascia in the midline. The subcutaneous tissue was reapproximated with running sutures of 0 Vicry. The skin was reapproximated with Ensorb. 59ml (in 30 of 0.5% bupivicaine and 45ml of NSS) of liposomal bupivicaine placed in the fascial and skin lines.  Instrument, sponge, and needle counts were correct prior to the abdominal closure and at the conclusion of the case.  The patient tolerated the procedure well and was transferred to the recovery room in stable condition.   Christeen Douglas, MD 06/25/2020

## 2020-06-15 NOTE — Transfer of Care (Signed)
Immediate Anesthesia Transfer of Care Note  Patient: Monique [name]  Procedure(s) Performed: CESAREAN SECTION  Patient Location: Mother/Baby  Anesthesia Type:Spinal  Level of Consciousness: awake, alert  and oriented  Airway & Oxygen Therapy: Patient Spontanous Breathing  Post-op Assessment: Report given to RN and Post -op Vital signs reviewed and stable  Post vital signs: Reviewed  Last Vitals:  Vitals Value Taken Time  BP 110/69 06/15/20 0051  Temp 36.9 C 06/15/20 0051  Pulse 77 06/15/20 0051  Resp 16 06/15/20 0051  SpO2 99 % 06/15/20 0051    Last Pain:  Vitals:   06/14/20 2213  TempSrc: Oral  PainSc:          Complications: No complications documented.

## 2020-06-16 LAB — CBC
HCT: 26.5 % — ABNORMAL LOW (ref 36.0–46.0)
Hemoglobin: 8.7 g/dL — ABNORMAL LOW (ref 12.0–15.0)
MCH: 28.9 pg (ref 26.0–34.0)
MCHC: 32.8 g/dL (ref 30.0–36.0)
MCV: 88 fL (ref 80.0–100.0)
Platelets: 163 10*3/uL (ref 150–400)
RBC: 3.01 MIL/uL — ABNORMAL LOW (ref 3.87–5.11)
RDW: 13.4 % (ref 11.5–15.5)
WBC: 19.4 10*3/uL — ABNORMAL HIGH (ref 4.0–10.5)
nRBC: 0 % (ref 0.0–0.2)

## 2020-06-16 MED ORDER — SIMETHICONE 80 MG PO CHEW: 80.0000 mg | CHEWABLE_TABLET | Freq: Three times a day (TID) | ORAL | 0 refills | Status: DC

## 2020-06-16 MED ORDER — DIPHENHYDRAMINE HCL 25 MG PO CAPS: 25.0000 mg | ORAL_CAPSULE | Freq: Four times a day (QID) | ORAL | 0 refills | Status: DC | PRN

## 2020-06-16 MED ORDER — ACETAMINOPHEN 500 MG PO TABS
1000.0000 mg | ORAL_TABLET | Freq: Four times a day (QID) | ORAL | Status: DC
  Administered 2020-06-16 (×2): 1000 mg via ORAL
  Filled 2020-06-16 (×2): qty 2

## 2020-06-16 MED ORDER — OXYCODONE HCL 5 MG PO TABS: 5.0000 mg | ORAL_TABLET | Freq: Four times a day (QID) | ORAL | 0 refills | Status: DC | PRN

## 2020-06-16 MED ORDER — FERROUS SULFATE 325 (65 FE) MG PO TABS: 325.0000 mg | ORAL_TABLET | Freq: Two times a day (BID) | ORAL | 0 refills | Status: DC

## 2020-06-16 MED ORDER — SENNOSIDES-DOCUSATE SODIUM 8.6-50 MG PO TABS: 2.0000 | ORAL_TABLET | ORAL | 0 refills | Status: DC

## 2020-06-16 MED ORDER — IBUPROFEN 800 MG PO TABS
800.0000 mg | ORAL_TABLET | Freq: Four times a day (QID) | ORAL | Status: DC
  Administered 2020-06-16: 800 mg via ORAL
  Filled 2020-06-16: qty 1

## 2020-06-16 MED ORDER — COCONUT OIL OIL: 1.0000 "application " | TOPICAL_OIL | 0 refills | Status: DC | PRN

## 2020-06-16 MED ORDER — IBUPROFEN 800 MG PO TABS: 800.0000 mg | ORAL_TABLET | Freq: Four times a day (QID) | ORAL | 0 refills | Status: DC

## 2020-06-16 MED ORDER — OXYCODONE HCL 5 MG PO TABS: 5.0000 mg | ORAL_TABLET | ORAL | 0 refills | Status: DC | PRN

## 2020-06-16 MED ORDER — ACETAMINOPHEN 500 MG PO TABS: 1000.0000 mg | ORAL_TABLET | Freq: Four times a day (QID) | ORAL | 0 refills | Status: DC

## 2020-06-16 MED ORDER — ACETAMINOPHEN 500 MG PO TABS
ORAL_TABLET | ORAL | Status: AC
  Filled 2020-06-16: qty 2

## 2020-06-16 NOTE — Progress Notes (Signed)
Mother discharged.  Discharge instructions given.  Mother verbalizes understanding.  Transported by auxiliary.  

## 2020-06-16 NOTE — Discharge Instructions (Signed)
Discharge Instructions:   Follow-up Appointment:  If there are any new medications, they have been ordered and will be available for pickup at the listed pharmacy on your way home from the hospital.   Call office if you have any of the following: headache, visual changes, fever >101.0 F, chills, shortness of breath, breast concerns, excessive vaginal bleeding, incision drainage or problems, leg pain or redness, depression or any other concerns. If you have vaginal discharge with an odor, let your doctor know.   It is normal to bleed for up to 6 weeks. You should not soak through more than 1 pad in 1 hour. If you have a blood clot larger than your fist with continued bleeding, call your doctor.   Activity: Do not lift > 10 lbs for 6 weeks (do not lift anything heavier than your baby). No intercourse, tampons, swimming pools, hot tubs, baths (only showers) for 6 weeks.  No driving for 1-2 weeks. Continue prenatal vitamin, especially if breastfeeding. Increase calories and fluids (water) while breastfeeding.   Your milk will come in, in the next couple of days (right now it is colostrum). You may have a slight fever when your milk comes in, but it should go away on its own.  If it does not, and rises above 101 F please call the doctor. You will also feel achy and your breasts will be firm. They will also start to leak. If you are breastfeeding, continue as you have been and you can pump/express milk for comfort.   If you have too much milk, your breasts can become engorged, which could lead to mastitis. This is an infection of the milk ducts. It can be very painful and you will need to notify your doctor to obtain a prescription for antibiotics. You can also treat it with a shower or hot/cold compress.   For concerns about your baby, please call your pediatrician.  For breastfeeding concerns, the lactation consultant can be reached at [phone].   Postpartum blues (feelings of happy one  minute and sad another minute) are normal for the first few weeks but if it gets worse let your doctor know.   Congratulations! We enjoyed caring for you and your new bundle of joy!   Cesarean Delivery, Care After This sheet gives you information about how to care for yourself after your procedure. Your health care provider may also give you more specific instructions. If you have problems or questions, contact your health care provider. What can I expect after the procedure? After the procedure, it is common to have:  A small amount of blood or clear fluid coming from the incision.  Some redness, swelling, and pain in your incision area.  Some abdominal pain and soreness.  Vaginal bleeding (lochia). Even though you did not have a vaginal delivery, you will still have vaginal bleeding and discharge.  Pelvic cramps.  Fatigue. You may have pain, swelling, and discomfort in the tissue between your vagina and your anus (perineum) if:  Your C-section was unplanned, and you were allowed to labor and push.  An incision was made in the area (episiotomy) or the tissue tore during attempted vaginal delivery. Follow these instructions at home: Incision care  Follow instructions from your health care provider about how to take care of your incision. Make sure you: ? Wash your hands with soap and water before you change your bandage (dressing). If soap and water are not available, use hand sanitizer. ? If you have a dressing,  change it or remove it as told by your health care provider. ? Leave stitches (sutures), skin staples, skin glue, or adhesive strips in place. These skin closures may need to stay in place for 2 weeks or longer. If adhesive strip edges start to loosen and curl up, you may trim the loose edges. Do not remove adhesive strips completely unless your health care provider tells you to do that.  Check your incision area every day for signs of infection. Check for: ? More redness,  swelling, or pain. ? More fluid or blood. ? Warmth. ? Pus or a bad smell.  Do not take baths, swim, or use a hot tub until your health care provider says it's okay. Ask your health care provider if you can take showers.  When you cough or sneeze, hug a pillow. This helps with pain and decreases the chance of your incision opening up (dehiscing). Do this until your incision heals.   Medicines  Take over-the-counter and prescription medicines only as told by your health care provider.  If you were prescribed an antibiotic medicine, take it as told by your health care provider. Do not stop taking the antibiotic even if you start to feel better.  Do not drive or use heavy machinery while taking prescription pain medicine. Lifestyle  Do not drink alcohol. This is especially important if you are breastfeeding or taking pain medicine.  Do not use any products that contain nicotine or tobacco, such as cigarettes, e-cigarettes, and chewing tobacco. If you need help quitting, ask your health care provider. Eating and drinking  Drink at least 8 eight-ounce glasses of water every day unless told not to by your health care provider. If you breastfeed, you may need to drink even more water.  Eat high-fiber foods every day. These foods may help prevent or relieve constipation. High-fiber foods include: ? Whole grain cereals and breads. ? Brown rice. ? Beans. ? Fresh fruits and vegetables. Activity  If possible, have someone help you care for your baby and help with household activities for at least a few days after you leave the hospital.  Return to your normal activities as told by your health care provider. Ask your health care provider what activities are safe for you.  Rest as much as possible. Try to rest or take a nap while your baby is sleeping.  Do not lift anything that is heavier than 10 lbs (4.5 kg), or the limit that you were told, until your health care provider says that it is  safe.  Talk with your health care provider about when you can engage in sexual activity. This may depend on your: ? Risk of infection. ? How fast you heal. ? Comfort and desire to engage in sexual activity.   General instructions  Do not use tampons or douches until your health care provider approves.  Wear loose, comfortable clothing and a supportive and well-fitting bra.  Keep your perineum clean and dry. Wipe from front to back when you use the toilet.  If you pass a blood clot, save it and call your health care provider to discuss. Do not flush blood clots down the toilet before you get instructions from your health care provider.  Keep all follow-up visits for you and your baby as told by your health care provider. This is important. Contact a health care provider if:  You have: ? A fever. ? Bad-smelling vaginal discharge. ? Pus or a bad smell coming from your incision. ?  Difficulty or pain when urinating. ? A sudden increase or decrease in the frequency of your bowel movements. ? More redness, swelling, or pain around your incision. ? More fluid or blood coming from your incision. ? A rash. ? Nausea. ? Little or no interest in activities you used to enjoy. ? Questions about caring for yourself or your baby.  Your incision feels warm to the touch.  Your breasts turn red or become painful or hard.  You feel unusually sad or worried.  You vomit.  You pass a blood clot from your vagina.  You urinate more than usual.  You are dizzy or light-headed. Get help right away if:  You have: ? Pain that does not go away or get better with medicine. ? Chest pain. ? Difficulty breathing. ? Blurred vision or spots in your vision. ? Thoughts about hurting yourself or your baby. ? New pain in your abdomen or in one of your legs. ? A severe headache.  You faint.  You bleed from your vagina so much that you fill more than one sanitary pad in one hour. Bleeding should not be  heavier than your heaviest period. Summary  After the procedure, it is common to have pain at your incision site, abdominal cramping, and slight bleeding from your vagina.  Check your incision area every day for signs of infection.  Tell your health care provider about any unusual symptoms.  Keep all follow-up visits for you and your baby as told by your health care provider. This information is not intended to replace advice given to you by your health care provider. Make sure you discuss any questions you have with your health care provider. Document Revised: 09/16/2017 Document Reviewed: 09/16/2017 Elsevier Patient Education  2021 [employer].

## 2020-06-16 NOTE — Progress Notes (Signed)
Pt. And Husband are eating dinner. Pt. And Infant have been discharged by previous RN. Instructed Mom to call after eating so that she can be taken to Exit via wheelchair. Pt. V/O.

## 2020-06-16 NOTE — Lactation Note (Signed)
This note was copied from a baby's chart. Lactation Consultation Note  Patient Name: Girl Vasilia Dise IDPOE'U Date: 06/16/2020 Reason for consult: Follow-up assessment Age:31 hours Lactation to the room for initial visit. Father is holding the baby. Encouraged feeding on demand and with cues. If baby is not cueing encouraged hand expression and skin to skin. Taught proper technique for hand expression and spoon feeding. LC and Mother expressed and fed to baby. Baby tolerated spoon feed well. Baby was left  Swaddled with FOB. Encouraged 8 or more good feeds after 24 HOL. Reviewed appropriate diapers for days of life and How to know your baby is getting enough to eat. Reviewed "Understanding Postpartum and Newborn Care" INJOY booklet at bedside. Mother's nipples are damaged. Parents had been told the nipple shield was causing gas. LC encouraged parents to use NS due to the damage Mother has with the latch and feed. Discussed pumping, pacifiers and bottle use in the first few weeks of life. Also discussed if breast rest is needed due to nipple damage. Reviewed how to properly place nipple shield, which parents were doing well. Encourgaed offering bother breasts during a feed session. [facility] # left on board, encouraged to call for any assistance. Parents have no further questions at this time.     Feeding Mother's Current Feeding Choice: Breast Milk   Lactation Tools Discussed/Used Nipple shield size: 20  Interventions Interventions: Breast feeding basics reviewed;Adjust position;Education;Expressed milk;Coconut oil (discussed use of different oils on her nipples, nipples are abraded, compression stripe)  Discharge Pump: Personal WIC Program: No  Consult Status Consult Status: Follow-up Date: 06/17/20 Follow-up type: Call as needed    Marillyn Goren D Timoteo Carreiro 06/16/2020, 5:21 PM

## 2020-06-16 NOTE — Anesthesia Postprocedure Evaluation (Signed)
Anesthesia Post Note  Patient: Monique [name]  Procedure(s) Performed: CESAREAN SECTION  Patient location during evaluation: Mother Baby Anesthesia Type: Spinal and Epidural Level of consciousness: awake and alert Pain management: pain level controlled Vital Signs Assessment: post-procedure vital signs reviewed and stable Respiratory status: spontaneous breathing, nonlabored ventilation and respiratory function stable Cardiovascular status: stable Postop Assessment: no headache, no backache, able to ambulate and adequate PO intake Anesthetic complications: no   No complications documented.   Last Vitals:  Vitals:   06/15/20 2316 06/16/20 0749  BP: 109/72 118/69  Pulse: 81 85  Resp: 18 18  Temp: 36.6 C 36.6 C  SpO2: 99% 98%    Last Pain:  Vitals:   06/16/20 1105  TempSrc:   PainSc: 6                  Lenard Simmer

## 2020-06-16 NOTE — Progress Notes (Signed)
Pt had requested all Rx to be sent to [facility] in [place].   Oxycodone 5mg  PO q4-6hr prn pain, disp #30, no RF sent to requested pharmacy.   Phone call from nursing staff at 1930, reporting that pharmacy was closed. Discussed limitations on narcotic Rx, electronic Rx and 7d total for postop pain.   Rx Oxycodone 5mg  PO q4-6 prn pain, disp #4 tablets sent [facility] to [facility] on [address], RN confirmed that pharmacy is still open.    M, CNM

## 2020-06-16 NOTE — Progress Notes (Signed)
Pt.states she did not pick up prescriptions and Pharmacy is closed. R. McVey CNM notified and she is calling in Prescription for 3 Oxycodone to be picked up by husband by 8. Pt. Instructed to Try Ibuprofen prior to Oxycodone and if not relief in 1.5 hours to take Oxycodone as prescribed.Pt.v/o.

## 2020-06-16 NOTE — Progress Notes (Signed)
Post Partum/POD 2  Subjective: Doing well, no complaints.  Tolerating regular diet, pain with PO meds, ambulating without difficulty. Voiding well.   No CP SOB Fever,Chills, N/V or leg pain; denies nipple or breast pain; no HA change of vision, RUQ/epigastric pain  Objective: BP 118/69 (BP Location: Left Arm)   Pulse 85   Temp 97.9 F (36.6 C)   Resp 18   Ht $R'5\' 4"'oZ$  (1.626 m)   Wt 90.3 kg   LMP 09/15/2019 (Exact Date)   SpO2 98%   Breastfeeding Unknown   BMI 34.16 kg/m    Physical Exam:  General: NAD Breasts: soft/nontender CV: RRR Pulm: nl effort, CTABL Abdomen: soft, NT, BS x 4 Incision: Pressure Dsg CDI Lochia: small Uterine Fundus: fundus firm and 1 fb below umbilicus DVT Evaluation: no cords, ttp LEs   Recent Labs    06/15/20 0602 06/16/20 0711  HGB 11.1* 8.7*  HCT 32.5* 26.5*  WBC 32.6* 19.4*  PLT 173 163    Assessment/Plan: 30 y.o. G1P1001 postpartum day # 1  - Continue routine PP care- encourage PO intake, ambulation. Plan to remove IV and shower, apply honeycomb dsg after shower.  - Lactation consult prn - Acute blood loss anemia - s/p PP hemorrhage with approx 2417ml QBL. hemodynamically stable and asymptomatic; continue po ferrous sulfate BID with stool softeners  - repeat CBC in am. Leukocytosis noted, afebrile.  - Immunization status: Needs  MMR prior to DC   Disposition: undecided about Dc home today.     Francetta Found, CNM 06/16/2020  11:08 AM

## 2020-06-16 NOTE — Anesthesia Post-op Follow-up Note (Signed)
  Anesthesia Pain Follow-up Note  Patient: Monique [name]  Day #: 2  Date of Follow-up: 06/16/2020 Time: 12:39 PM  Last Vitals:  Vitals:   06/15/20 2316 06/16/20 0749  BP: 109/72 118/69  Pulse: 81 85  Resp: 18 18  Temp: 36.6 C 36.6 C  SpO2: 99% 98%    Level of Consciousness: alert  Pain: none   Side Effects:Pruritis  Catheter Site Exam:clean, dry     Plan: D/C from anesthesia care at surgeon's request  Lenard Simmer

## 2020-06-16 NOTE — Progress Notes (Signed)
Discharged to home with Sig. Other and Infant. Denies c/o. Prescriptions picked up by  Sig. Other.

## 2020-06-16 NOTE — Discharge Summary (Addendum)
Obstetrical Discharge Summary  Patient Name: Monique [name] DOB: [birth year] MRN: [id number]  Date of Admission: 06/13/2020 Date of Delivery: 06/14/20 Delivered by: Dalbert Garnet MD Date of Discharge: 06/16/2020  Primary OB: [facility]  [id number] last menstrual period was 09/15/2019 (exact date). EDC Estimated Date of Delivery: 06/21/20 Gestational Age at Delivery: [redacted]w[redacted]d   Antepartum complications:  1.Rubella: Non immune  2. Gestational diabetes   Admitting Diagnosis: GDMA1, 38.6wks Secondary Diagnosis: arrest of descent, LTCS  Patient Active Problem List   Diagnosis Date Noted  . Failure to progress in labor   . Gestational diabetes 06/13/2020  . Encounter for induction of labor 06/13/2020    Augmentation: AROM and Pitocin Complications: None and Hemorrhage>1033mL Intrapartum complications/course: admitted for IOL due to GDM, slowly progressed to anterior lip with cat II tracing. After 6hrs without descent, decision for elective primary CS made. See Op notes.  EBL with Hgb 12.1 ->11.1 ->8.7; WBC 12.8->  32.6-> 19.4; pt remained afebrile throughout.  Date of Delivery: 06/14/20 Delivered By: Christeen Douglas MD Delivery Type: primary cesarean section, low transverse incision Anesthesia: epidural and spinal Placenta: manual Laceration: none Episiotomy: none Newborn Data: Live born female "Addilyn" Birth Weight: 8 lb 5.7 oz (3790 g) APGAR: 8, 9  Newborn Delivery   Birth date/time: 06/14/2020 23:36:00 Delivery type: C-Section, Low Transverse Trial of labor: No C-section categorization: Primary        Postpartum Procedures: none  Edinburgh:  Edinburgh Postnatal Depression Scale Screening Tool 06/16/2020  I have been able to laugh and see the funny side of things. (No Data)      Post partum course-Cesarean Section:  Patient had an uncomplicated postpartum course.  By time of discharge on POD#2, her pain was controlled on oral pain medications; she had  appropriate lochia and was ambulating, voiding without difficulty, tolerating regular diet and passing flatus.   She was deemed stable for discharge to home.    Discharge Physical Exam:  BP 113/79 (BP Location: Left Arm)   Pulse 73   Temp 97.9 F (36.6 C)   Resp 18   Ht 5\' 4"  (1.626 m)   Wt 90.3 kg   LMP 09/15/2019 (Exact Date)   SpO2 99%   Breastfeeding Unknown   BMI 34.16 kg/m   General: NAD CV: RRR Pulm: CTABL, nl effort ABD: s/nd/nt, fundus firm and below the umbilicus Lochia: moderate Incision: c/d/i, healing well, no significant drainage, no dehiscence, no significant erythema DVT Evaluation: LE non-ttp, no evidence of DVT on exam.  Hemoglobin  Date Value Ref Range Status  06/16/2020 8.7 (L) 12.0 - 15.0 g/dL Final   HCT  Date Value Ref Range Status  06/16/2020 26.5 (L) 36.0 - 46.0 % Final     Disposition: stable, discharge to home. Baby Feeding: breastmilk Baby Disposition: home with mom  Rh Immune globulin given: n/a Rubella vaccine given: NOn-Immune, Offered prior to DC- declined Varicella vaccine given: immune Tdap vaccine given in AP or PP setting: declined Flu vaccine given in AP or PP setting: 03/21/20  Contraception: TBD  Prenatal Labs:  Blood type/Rh B pos  Antibody screen neg  Rubella Non-Immune  Varicella Immune  RPR NR  HBsAg Neg  HIV NR  GC neg  Chlamydia neg  Genetic screening negative  1 hour GTT 138  3 hour GTT   GBS Neg      Plan:  Monique [name] was discharged to home in good condition. Follow-up appointment with delivering provider in 2 weeks.  Discharge Medications: Allergies as  of 06/16/2020   No Known Allergies     Medication List    TAKE these medications   acetaminophen 500 MG tablet Commonly known as: TYLENOL Take 2 tablets (1,000 mg total) by mouth every 6 (six) hours.   amphetamine-dextroamphetamine 30 MG tablet Commonly known as: ADDERALL Take 30 mg by mouth 2 (two) times daily. Takes 1/2 tab is 15  mg   coconut oil Oil Apply 1 application topically as needed.   dicyclomine 10 MG capsule Commonly known as: BENTYL Take 1 capsule by mouth 3 (three) times daily.   diphenhydrAMINE 25 mg capsule Commonly known as: BENADRYL Take 1 capsule (25 mg total) by mouth every 6 (six) hours as needed for itching.   famotidine 20 MG tablet Commonly known as: PEPCID Take 20 mg by mouth 2 (two) times daily.   ferrous sulfate 325 (65 FE) MG tablet Take 1 tablet (325 mg total) by mouth 2 (two) times daily with a meal. Start taking on: June 17, 2020   ibuprofen 800 MG tablet Commonly known as: ADVIL Take 1 tablet (800 mg total) by mouth every 6 (six) hours. Start taking on: June 17, 2020   oxyCODONE 5 MG immediate release tablet Commonly known as: Oxy IR/ROXICODONE Take 1 tablet (5 mg total) by mouth every 4 (four) hours as needed for up to 5 days for moderate pain.   Prenatal 28-0.8 MG Tabs Take 1 tablet by mouth daily.   senna-docusate 8.6-50 MG tablet Commonly known as: Senokot-S Take 2 tablets by mouth daily. Start taking on: June 17, 2020   simethicone 80 MG chewable tablet Commonly known as: MYLICON Chew 1 tablet (80 mg total) by mouth 3 (three) times daily after meals. Start taking on: June 17, 2020        Follow-up Information    Christeen Douglas, MD In 2 weeks.   Specialty: Obstetrics and Gynecology Why: For postop check Contact information: [address] [place] [address] [phone]               Signed:  Randa Ngo, CNM 06/16/2020  6:12 PM

## 2020-06-20 ENCOUNTER — Other Ambulatory Visit: Payer: Self-pay | Admitting: Obstetrics and Gynecology

## 2020-06-20 ENCOUNTER — Encounter: Payer: Self-pay | Admitting: Obstetrics and Gynecology

## 2020-06-20 ENCOUNTER — Inpatient Hospital Stay
Admission: EM | Admit: 2020-06-20 | Discharge: 2020-06-20 | Discharge status: Absent without leave | Payer: 59 | Admitting: Obstetrics and Gynecology

## 2020-06-20 ENCOUNTER — Inpatient Hospital Stay
Admission: EM | Admit: 2020-06-20 | Discharge: 2020-06-22 | DRG: 776 | Disposition: A | Discharge status: Home / Self Care | Payer: 59 | Attending: Obstetrics and Gynecology | Admitting: Obstetrics and Gynecology

## 2020-06-20 ENCOUNTER — Other Ambulatory Visit: Payer: Self-pay

## 2020-06-20 DIAGNOSIS — Z20822 Contact with and (suspected) exposure to covid-19: Secondary | ICD-10-CM | POA: Diagnosis present

## 2020-06-20 DIAGNOSIS — O165 Unspecified maternal hypertension, complicating the puerperium: Secondary | ICD-10-CM | POA: Diagnosis present

## 2020-06-20 LAB — CBC
HCT: 28.4 % — ABNORMAL LOW (ref 36.0–46.0)
Hemoglobin: 9.6 g/dL — ABNORMAL LOW (ref 12.0–15.0)
MCH: 29.5 pg (ref 26.0–34.0)
MCHC: 33.8 g/dL (ref 30.0–36.0)
MCV: 87.4 fL (ref 80.0–100.0)
Platelets: 281 10*3/uL (ref 150–400)
RBC: 3.25 MIL/uL — ABNORMAL LOW (ref 3.87–5.11)
RDW: 13.1 % (ref 11.5–15.5)
WBC: 11.5 10*3/uL — ABNORMAL HIGH (ref 4.0–10.5)
nRBC: 0 % (ref 0.0–0.2)

## 2020-06-20 LAB — PROTEIN / CREATININE RATIO, URINE
Creatinine, Urine: 20 mg/dL
Total Protein, Urine: <6 mg/dL

## 2020-06-20 LAB — TYPE AND SCREEN
ABO/RH(D): B POS
Antibody Screen: NEGATIVE

## 2020-06-20 LAB — COMPREHENSIVE METABOLIC PANEL
ALT: 21 U/L (ref 0–44)
AST: 20 U/L (ref 15–41)
Albumin: 2.8 g/dL — ABNORMAL LOW (ref 3.5–5.0)
Alkaline Phosphatase: 141 U/L — ABNORMAL HIGH (ref 38–126)
Anion gap: 8 (ref 5–15)
BUN: 12 mg/dL (ref 6–20)
CO2: 24 mmol/L (ref 22–32)
Calcium: 8.9 mg/dL (ref 8.9–10.3)
Chloride: 108 mmol/L (ref 98–111)
Creatinine, Ser: 0.66 mg/dL (ref 0.44–1.00)
GFR, Estimated: >60 mL/min (ref >60)
Glucose, Bld: 104 mg/dL — ABNORMAL HIGH (ref 70–99)
Potassium: 3.7 mmol/L (ref 3.5–5.1)
Sodium: 140 mmol/L (ref 135–145)
Total Bilirubin: 0.4 mg/dL (ref 0.3–1.2)
Total Protein: 6 g/dL — ABNORMAL LOW (ref 6.5–8.1)

## 2020-06-20 LAB — RESP PANEL BY RT-PCR (FLU A&B, COVID) ARPGX2
Influenza A by PCR: NEGATIVE
Influenza B by PCR: NEGATIVE
SARS Coronavirus 2 by RT PCR: NEGATIVE

## 2020-06-20 MED ORDER — HYDRALAZINE HCL 20 MG/ML IJ SOLN: 10.0000 mg | INTRAMUSCULAR | Status: DC | PRN

## 2020-06-20 MED ORDER — LABETALOL HCL 5 MG/ML IV SOLN
20.0000 mg | INTRAVENOUS | Status: DC | PRN
  Administered 2020-06-20: 20 mg via INTRAVENOUS
  Filled 2020-06-20: qty 4

## 2020-06-20 MED ORDER — ONDANSETRON HCL 4 MG/2ML IJ SOLN: 4.0000 mg | Freq: Four times a day (QID) | INTRAMUSCULAR | Status: DC | PRN

## 2020-06-20 MED ORDER — LABETALOL HCL 5 MG/ML IV SOLN: 20.0000 mg | INTRAVENOUS | Status: DC | PRN

## 2020-06-20 MED ORDER — LIDOCAINE HCL (PF) 1 % IJ SOLN: 30.0000 mL | INTRAMUSCULAR | Status: DC | PRN

## 2020-06-20 MED ORDER — LABETALOL HCL 5 MG/ML IV SOLN: 40.0000 mg | INTRAVENOUS | Status: DC | PRN

## 2020-06-20 MED ORDER — OXYCODONE-ACETAMINOPHEN 5-325 MG PO TABS
2.0000 | ORAL_TABLET | ORAL | Status: DC | PRN
  Administered 2020-06-20: 2 via ORAL
  Filled 2020-06-20: qty 2

## 2020-06-20 MED ORDER — MAGNESIUM SULFATE 40 GM/1000ML IV SOLN
2.0000 g/h | INTRAVENOUS | Status: DC
  Administered 2020-06-21: 2 g/h via INTRAVENOUS
  Filled 2020-06-20 (×2): qty 1000

## 2020-06-20 MED ORDER — ACETAMINOPHEN 325 MG PO TABS
650.0000 mg | ORAL_TABLET | ORAL | Status: DC | PRN
  Administered 2020-06-20 – 2020-06-22 (×4): 650 mg via ORAL
  Filled 2020-06-20 (×4): qty 2

## 2020-06-20 MED ORDER — SOD CITRATE-CITRIC ACID 500-334 MG/5ML PO SOLN: 30.0000 mL | ORAL | Status: DC | PRN

## 2020-06-20 MED ORDER — OXYCODONE-ACETAMINOPHEN 5-325 MG PO TABS
1.0000 | ORAL_TABLET | ORAL | Status: DC | PRN
  Administered 2020-06-21 – 2020-06-22 (×3): 1 via ORAL
  Filled 2020-06-20 (×3): qty 1

## 2020-06-20 MED ORDER — LABETALOL HCL 5 MG/ML IV SOLN: 80.0000 mg | INTRAVENOUS | Status: DC | PRN

## 2020-06-20 MED ORDER — LACTATED RINGERS IV SOLN: INTRAVENOUS | Status: DC

## 2020-06-20 MED ORDER — MAGNESIUM SULFATE BOLUS VIA INFUSION
4.0000 g | Freq: Once | INTRAVENOUS | Status: AC
  Administered 2020-06-20: 4 g via INTRAVENOUS
  Filled 2020-06-20: qty 1000

## 2020-06-20 NOTE — ED Notes (Signed)
Pt attempting to get two visitors up to L&D. L&D states that pt can only have one visitor with no switching. Pt wanted grandma to go upstairs then switch with husband while husband went home and go get their clothes for the stay. Pt very upset that L&D not allowing this. Pt states "we're going home and feeding the baby and getting clothes and if I have a seizure that's on y'all". Pt states that they will come back.

## 2020-06-20 NOTE — H&P (Signed)
Monique [name] is a 30 y.o. female presenting for PP  Day #5 edema and swelling . BP in office 162/100. GDM in pregnancy , no HTN issues  C/S for arrest of descent   OB History    Gravida  1   Para  1   Term  1   Preterm      AB      Living  1     SAB      IAB      Ectopic      Multiple  0   Live Births  1          Past Medical History:  Diagnosis Date  . ADHD   . Family history of factor V Leiden mutation    pt has not been tested at this time  . GERD (gastroesophageal reflux disease)   . Gestational diabetes    Past Surgical History:  Procedure Laterality Date  . CESAREAN SECTION  06/14/2020   Procedure: CESAREAN SECTION;  Surgeon: Christeen Douglas, MD;  Location: [facility];  Service: Obstetrics;;  . DILATION AND EVACUATION N/A 04/08/2019   Procedure: DILATATION AND EVACUATION;  Surgeon: Maxie Better, MD;  Location: [facility];  Service: Gynecology;  Laterality: N/A;  . OPERATIVE ULTRASOUND N/A 04/08/2019   Procedure: OPERATIVE ULTRASOUND;  Surgeon: Maxie Better, MD;  Location: [facility];  Service: Gynecology;  Laterality: N/A;  . WISDOM TOOTH EXTRACTION     Family History: family history includes Diabetes in her maternal grandmother; Factor V Leiden deficiency in her father; Healthy in her mother. Social History:  reports that she has never smoked. She has never used smokeless tobacco. She reports previous alcohol use. She reports previous drug use. Drug: Marijuana.      Review of Systems  Review of Systems: A full review of systems was performed and negative except as noted in the HPI.   Eyes: no vision change  Ears: left ear pain  Oropharynx: no sore throat  Pulmonary . No shortness of breath , no hemoptysis Cardiovascular: no chest pain , no irregular heart beat  Gastrointestinal:no blood in stool . No diarrhea, no constipation Uro gynecologic: no dysuria , no pelvic pain Neurologic : no seizure , no  migraines    Musculoskeletal: no muscular weakness lynphatics : + edema History   Last menstrual period 09/15/2019, unknown if currently breastfeeding. Exam Physical Exam  BP 162/100  Lungs cta ( per Dr Dalbert Garnet )  CV RRR  + lower extremity edema3000 cc / 24 hours  Prenatal labs: ABO, Rh: --/--/B POS Performed at [facility], [address] [place], [place] [address]  [phone]) Antibody: NEG (03/23 2210) Rubella: Nonimmune (09/14 0000) RPR: NON REACTIVE (03/23 2210)  HBsAg: Negative (09/04 0000)  HIV: Non-reactive (03/03 0000)  GBS: Negative/-- (03/03 0000)   Assessment/Plan: PP hypertension  and edema  Admit start on Magnesium 4 gm and then 2 gm  Labetalol  20 mg q iv 2hrs to keep BP <160/110  I+Os Total fluid intake < 3000 /24 hrs Check  cmp and P/Cr ratio and cbc  Ihor Austin Tanveer Dobberstein 06/20/2020, 5:36 PM

## 2020-06-21 LAB — RPR: RPR Ser Ql: NONREACTIVE

## 2020-06-21 LAB — URINALYSIS, COMPLETE (UACMP) WITH MICROSCOPIC
Bacteria, UA: NONE SEEN
Bilirubin Urine: NEGATIVE
Glucose, UA: NEGATIVE mg/dL
Ketones, ur: NEGATIVE mg/dL
Leukocytes,Ua: NEGATIVE
Nitrite: NEGATIVE
Protein, ur: NEGATIVE mg/dL
Specific Gravity, Urine: 1.004 — ABNORMAL LOW (ref 1.005–1.030)
Squamous Epithelial / HPF: NONE SEEN (ref 0–5)
pH: 7 (ref 5.0–8.0)

## 2020-06-21 MED ORDER — SENNOSIDES-DOCUSATE SODIUM 8.6-50 MG PO TABS: 1.0000 | ORAL_TABLET | Freq: Every evening | ORAL | Status: DC | PRN

## 2020-06-21 MED ORDER — CALCIUM GLUCONATE 10 % IV SOLN
INTRAVENOUS | Status: AC
  Filled 2020-06-21: qty 10

## 2020-06-21 MED ORDER — LABETALOL HCL 200 MG PO TABS
200.0000 mg | ORAL_TABLET | Freq: Two times a day (BID) | ORAL | Status: DC
  Administered 2020-06-21 (×2): 200 mg via ORAL
  Filled 2020-06-21 (×2): qty 2

## 2020-06-21 MED ORDER — FUROSEMIDE 10 MG/ML IJ SOLN
20.0000 mg | Freq: Once | INTRAMUSCULAR | Status: AC
  Administered 2020-06-21: 20 mg via INTRAVENOUS
  Filled 2020-06-21: qty 2

## 2020-06-21 NOTE — H&P (Signed)
Monique [name] is a 30 y.o. female presenting for PP  Day #5 edema and swelling . BP in office 162/100. GDM in pregnancy , no HTN issues  C/S for arrest of descent           OB History    Gravida  1   Para  1   Term  1   Preterm      AB      Living  1     SAB      IAB      Ectopic      Multiple  0   Live Births  1              Past Medical History:  Diagnosis Date  . ADHD   . Family history of factor V Leiden mutation    pt has not been tested at this time  . GERD (gastroesophageal reflux disease)   . Gestational diabetes         Past Surgical History:  Procedure Laterality Date  . CESAREAN SECTION  06/14/2020   Procedure: CESAREAN SECTION;  Surgeon: Beasley, Bethany, MD;  Location: [facility];  Service: Obstetrics;;  . DILATION AND EVACUATION N/A 04/08/2019   Procedure: DILATATION AND EVACUATION;  Surgeon: Cousins, Sheronette, MD;  Location: [facility];  Service: Gynecology;  Laterality: N/A;  . OPERATIVE ULTRASOUND N/A 04/08/2019   Procedure: OPERATIVE ULTRASOUND;  Surgeon: Cousins, Sheronette, MD;  Location: [facility];  Service: Gynecology;  Laterality: N/A;  . WISDOM TOOTH EXTRACTION     Family History: family history includes Diabetes in her maternal grandmother; Factor V Leiden deficiency in her father; Healthy in her mother. Social History:  reports that she has never smoked. She has never used smokeless tobacco. She reports previous alcohol use. She reports previous drug use. Drug: Marijuana.      Review of Systems  Review of Systems: A full review of systems was performed and negativeexcept as noted in the HPI.  Eyes: no vision change  Ears: left ear pain  Oropharynx: no sore throat  Pulmonary . No shortness of breath , no hemoptysis Cardiovascular: no chest pain , no irregular heart beat  Gastrointestinal:no blood in stool . No diarrhea, no constipation Uro gynecologic: no dysuria , no  pelvic pain Neurologic : no seizure , no migraines  Musculoskeletal: no muscular weakness lynphatics : + edema History Last menstrual period 09/15/2019, unknown if currently breastfeeding. Exam Physical Exam  BP 162/100  Lungs cta ( per Dr Beasley )  CV RRR  + lower extremity edema3000 cc / 24 hours  Prenatal labs: ABO, Rh: --/--/B POS Performed at [facility], [address] [place], [place] [address]  (03/23 2336) Antibody: NEG (03/23 2210) Rubella: Nonimmune (09/14 0000) RPR: NON REACTIVE (03/23 2210)  HBsAg: Negative (09/04 0000)  HIV: Non-reactive (03/03 0000)  GBS: Negative/-- (03/03 0000)   Assessment/Plan: PP hypertension  and edema  Admit start on Magnesium 4 gm and then 2 gm  Labetalol  20 mg q iv 2hrs to keep BP <160/110  I+Os Total fluid intake < 3000 /24 hrs Check  cmp and P/Cr ratio and cbc  Monique [name] 06/20/2020, 5:36 PM           Electronically signed by Monique [name] J, MD at 06/20/2020 5:45 PM  

## 2020-06-21 NOTE — Progress Notes (Signed)
Post Partum Day 7/Hospital day 2 Admission date: 06/20/2020 Subjective:   Doing well, no complaints.  Tolerating regular diet, pain with PO meds, voiding and ambulating without difficulty. Reports feeling significant urgency of urination then has hesitation and starts to void with limited stream and some pressure.   No CP SOB Fever,Chills, N/V or leg pain; denies nipple or breast pain; no HA change of vision, RUQ/epigastric pain  Objective: BP 130/88 (BP Location: Left Arm)   Pulse 88   Temp 98.3 F (36.8 C) (Axillary)   Resp 18   Ht 5\' 4"  (1.626 m)   Wt 88.9 kg   LMP 09/15/2019 (Exact Date)   SpO2 97%   BMI 33.64 kg/m    Physical Exam:  General: NAD Breasts: soft/nontender CV: RRR Pulm: nl effort, CTABL Abdomen: soft, NT, BS x 4 Incision: Honeycomb dsg CDI, no erythema or drainage Lochia: small Uterine Fundus: fundus firm and 3 fb below umbilicus DVT Evaluation: no cords, ttp LEs; 3+ DTRs, 1 bt clonus. 2+ LE edema to knees.    Recent Labs    06/20/20 1941  HGB 9.6*  HCT 28.4*  WBC 11.5*  PLT 281    Intake/Output Summary (Last 24 hours) at 06/21/2020 06/23/2020 Last data filed at 06/21/2020 0701 Gross per 24 hour  Intake 3458.99 ml  Output 4500 ml  Net -1041.01 ml      Assessment/Plan: 30 y.o. G1P1001 postpartum day # 7  - Continue routine PP care - c/o dysuria- clean catch urine ordered with culture. - Continue Mag until 24hrs (1900), diuresing well but continues to have brisk reflexes and 1bt clonus.   - discussed with Dr 02-14-1994, pt to have Lasix IV now and start PO Labetalol  - Lactation consult as needed.    Disposition: remain inpt    Dalbert Garnet, CNM 06/21/2020 8:26 AM

## 2020-06-21 NOTE — H&P (Signed)
Monique [name] is a 30 y.o. female presenting for PP  Day #5 edema and swelling . BP in office 162/100. GDM in pregnancy , no HTN issues  C/S for arrest of descent           OB History    Gravida  1   Para  1   Term  1   Preterm      AB      Living  1     SAB      IAB      Ectopic      Multiple  0   Live Births  1              Past Medical History:  Diagnosis Date  . ADHD   . Family history of factor V Leiden mutation    pt has not been tested at this time  . GERD (gastroesophageal reflux disease)   . Gestational diabetes         Past Surgical History:  Procedure Laterality Date  . CESAREAN SECTION  06/14/2020   Procedure: CESAREAN SECTION;  Surgeon: Christeen Douglas, MD;  Location: [facility];  Service: Obstetrics;;  . DILATION AND EVACUATION N/A 04/08/2019   Procedure: DILATATION AND EVACUATION;  Surgeon: Maxie Better, MD;  Location: [facility];  Service: Gynecology;  Laterality: N/A;  . OPERATIVE ULTRASOUND N/A 04/08/2019   Procedure: OPERATIVE ULTRASOUND;  Surgeon: Maxie Better, MD;  Location: [facility];  Service: Gynecology;  Laterality: N/A;  . WISDOM TOOTH EXTRACTION     Family History: family history includes Diabetes in her maternal grandmother; Factor V Leiden deficiency in her father; Healthy in her mother. Social History:  reports that she has never smoked. She has never used smokeless tobacco. She reports previous alcohol use. She reports previous drug use. Drug: Marijuana.      Review of Systems  Review of Systems: A full review of systems was performed and negativeexcept as noted in the HPI.  Eyes: no vision change  Ears: left ear pain  Oropharynx: no sore throat  Pulmonary . No shortness of breath , no hemoptysis Cardiovascular: no chest pain , no irregular heart beat  Gastrointestinal:no blood in stool . No diarrhea, no constipation Uro gynecologic: no dysuria , no  pelvic pain Neurologic : no seizure , no migraines  Musculoskeletal: no muscular weakness lynphatics : + edema History Last menstrual period 09/15/2019, unknown if currently breastfeeding. Exam Physical Exam  BP 162/100  Lungs cta ( per Dr Dalbert Garnet )  CV RRR  + lower extremity edema3000 cc / 24 hours  Prenatal labs: ABO, Rh: --/--/B POS Performed at [facility], [address] [place], [place] [address]  [phone] 2336) Antibody: NEG (03/23 2210) Rubella: Nonimmune (09/14 0000) RPR: NON REACTIVE (03/23 2210)  HBsAg: Negative (09/04 0000)  HIV: Non-reactive (03/03 0000)  GBS: Negative/-- (03/03 0000)   Assessment/Plan: PP hypertension  and edema  Admit start on Magnesium 4 gm and then 2 gm  Labetalol  20 mg q iv 2hrs to keep BP <160/110  I+Os Total fluid intake < 3000 /24 hrs Check  cmp and P/Cr ratio and cbc  Ihor Austin [employer] 06/20/2020, 5:36 PM           Electronically signed by Suzy Bouchard, MD at 06/20/2020 5:45 PM

## 2020-06-22 LAB — COMPREHENSIVE METABOLIC PANEL
ALT: 18 U/L (ref 0–44)
AST: 19 U/L (ref 15–41)
Albumin: 2.8 g/dL — ABNORMAL LOW (ref 3.5–5.0)
Alkaline Phosphatase: 121 U/L (ref 38–126)
Anion gap: 9 (ref 5–15)
BUN: 15 mg/dL (ref 6–20)
CO2: 23 mmol/L (ref 22–32)
Calcium: 8.2 mg/dL — ABNORMAL LOW (ref 8.9–10.3)
Chloride: 107 mmol/L (ref 98–111)
Creatinine, Ser: 0.81 mg/dL (ref 0.44–1.00)
GFR, Estimated: >60 mL/min (ref >60)
Glucose, Bld: 88 mg/dL (ref 70–99)
Potassium: 4.2 mmol/L (ref 3.5–5.1)
Sodium: 139 mmol/L (ref 135–145)
Total Bilirubin: 0.4 mg/dL (ref 0.3–1.2)
Total Protein: 6.3 g/dL — ABNORMAL LOW (ref 6.5–8.1)

## 2020-06-22 LAB — CBC
HCT: 30.2 % — ABNORMAL LOW (ref 36.0–46.0)
Hemoglobin: 9.8 g/dL — ABNORMAL LOW (ref 12.0–15.0)
MCH: 28.7 pg (ref 26.0–34.0)
MCHC: 32.5 g/dL (ref 30.0–36.0)
MCV: 88.3 fL (ref 80.0–100.0)
Platelets: 345 10*3/uL (ref 150–400)
RBC: 3.42 MIL/uL — ABNORMAL LOW (ref 3.87–5.11)
RDW: 13.4 % (ref 11.5–15.5)
WBC: 10 10*3/uL (ref 4.0–10.5)
nRBC: 0 % (ref 0.0–0.2)

## 2020-06-22 LAB — CULTURE, OB URINE: Culture: <10000 — AB

## 2020-06-22 MED ORDER — LABETALOL HCL 200 MG PO TABS
400.0000 mg | ORAL_TABLET | Freq: Two times a day (BID) | ORAL | Status: DC
  Administered 2020-06-22: 400 mg via ORAL
  Filled 2020-06-22: qty 2

## 2020-06-22 MED ORDER — LABETALOL HCL 200 MG PO TABS: 400.0000 mg | ORAL_TABLET | Freq: Two times a day (BID) | ORAL | 0 refills | Status: DC

## 2020-06-22 MED ORDER — NIFEDIPINE ER OSMOTIC RELEASE 30 MG PO TB24
30.0000 mg | ORAL_TABLET | Freq: Every day | ORAL | Status: DC
  Administered 2020-06-22: 30 mg via ORAL
  Filled 2020-06-22: qty 1

## 2020-06-22 MED ORDER — NIFEDIPINE ER 30 MG PO TB24: 30.0000 mg | ORAL_TABLET | Freq: Every day | ORAL | 0 refills | Status: DC

## 2020-06-22 NOTE — Discharge Summary (Signed)
Obstetrical Discharge Summary  Patient Name: Monique [name] DOB: Aug 21, 1990 MRN: [id number]  Date of Admission: 06/20/2020 Date of Discharge: 06/22/2020  [name]:Patient's last menstrual period was 09/15/2019 (exact date). EDC Estimated Date of Delivery: 06/21/20 Gestational Age at Delivery: [redacted]w[redacted]d   Admitting Diagnosis: Postpartum hypertension Secondary Diagnosis:same  Patient Active Problem List   Diagnosis Date Noted  . Postpartum hypertension 06/20/2020  . Failure to progress in labor   . Gestational diabetes 06/13/2020  . Encounter for induction of labor 06/13/2020    Date of Delivery: 06/14/20 Delivered By: Monique Garnet MD Delivery Type: primary cesarean section, low transverse incision  Newborn Data: Live born female  Birth Weight: 8 lb 5.7 oz (3790 g) APGAR: 8, 9  Newborn Delivery   Birth date/time: 06/14/2020 23:36:00 Delivery type: C-Section, Low Transverse Trial of labor: No C-section categorization: Primary      Edinburgh:  Edinburgh Postnatal Depression Scale Screening Tool 06/22/2020 06/16/2020 06/16/2020  I have been able to laugh and see the funny side of things. (No Data) 0 (No Data)  I have looked forward with enjoyment to things. - 0 -  I have blamed myself unnecessarily when things went wrong. - 1 -  I have been anxious or worried for no good reason. - 0 -  I have felt scared or panicky for no good reason. - 0 -  Things have been getting on top of me. - 1 -  I have been so unhappy that I have had difficulty sleeping. - 0 -  I have felt sad or miserable. - 0 -  I have been so unhappy that I have been crying. - 0 -  The thought of harming myself has occurred to me. - 0 -  Edinburgh Postnatal Depression Scale Total - 2 -     Hospital course: Pt readmitted on 06/20/20 with severe range BP and started on Magnesium for seizure prophylaxis. She was given Lasix IV x 1 dose on 06/21/20 and started on Labetalol 200mg  PO BID for mgmt of mild  range BP. Magnesium was infused for 24hrs, and Labetalol was titrated up to 400mg  PO BID and Procardia 30mg  XL was added on 06/22/20.  She was deemed stable for discharge with close followup.   Discharge Physical Exam:  BP 124/90 (BP Location: Left Leg)   Pulse 73   Temp 98.6 F (37 C)   Resp 18   Ht 5\' 4"  (1.626 m)   Wt 88.9 kg   LMP 09/15/2019 (Exact Date)   SpO2 98%   BMI 33.64 kg/m   General: NAD CV: RRR Pulm: CTABL, nl effort ABD: s/nd/nt, fundus firm and below the umbilicus Lochia: moderate Incision: c/d/i, healing well, no significant drainage, no dehiscence, no significant erythema DVT Evaluation: LE non-ttp, no evidence of DVT on exam., bilateral pedal and ankle edema, 2+ DTR, no clonus  Hemoglobin  Date Value Ref Range Status  06/22/2020 9.8 (L) 12.0 - 15.0 g/dL Final   HCT  Date Value Ref Range Status  06/22/2020 30.2 (L) 36.0 - 46.0 % Final   Component     Latest Ref Rng & Units 06/22/2020  Sodium     135 - 145 mmol/L 139  Potassium     3.5 - 5.1 mmol/L 4.2  Chloride     98 - 111 mmol/L 107  CO2     22 - 32 mmol/L 23  Glucose     70 - 99 mg/dL 88  BUN     6 -  20 mg/dL 15  Creatinine     9.35 - 1.00 mg/dL 7.01  Calcium     8.9 - 10.3 mg/dL 8.2 (L)  Total Protein     6.5 - 8.1 g/dL 6.3 (L)  Albumin     3.5 - 5.0 g/dL 2.8 (L)  AST     15 - 41 U/L 19  ALT     0 - 44 U/L 18  Alkaline Phosphatase     38 - 126 U/L 121  Total Bilirubin     0.3 - 1.2 mg/dL 0.4  GFR, Estimated     >60 mL/min >60  Anion gap     5 - 15 9      Plan:  Monique [name] was discharged to home in good condition. Follow-up appointment with delivering provider in 3days  Discharge Medications: Allergies as of 06/22/2020   No Known Allergies     Medication List    TAKE these medications   acetaminophen 500 MG tablet Commonly known as: TYLENOL Take 2 tablets (1,000 mg total) by mouth every 6 (six) hours.   amphetamine-dextroamphetamine 30 MG tablet Commonly known  as: ADDERALL Take 30 mg by mouth 2 (two) times daily. Takes 1/2 tab is 15 mg   coconut oil Oil Apply 1 application topically as needed.   dicyclomine 10 MG capsule Commonly known as: BENTYL Take 1 capsule by mouth 3 (three) times daily.   diphenhydrAMINE 25 mg capsule Commonly known as: BENADRYL Take 1 capsule (25 mg total) by mouth every 6 (six) hours as needed for itching.   famotidine 20 MG tablet Commonly known as: PEPCID Take 20 mg by mouth 2 (two) times daily.   ferrous sulfate 325 (65 FE) MG tablet Take 1 tablet (325 mg total) by mouth 2 (two) times daily with a meal.   ibuprofen 800 MG tablet Commonly known as: ADVIL Take 1 tablet (800 mg total) by mouth every 6 (six) hours.   labetalol 200 MG tablet Commonly known as: NORMODYNE Take 2 tablets (400 mg total) by mouth 2 (two) times daily.   NIFEdipine 30 MG 24 hr tablet Commonly known as: ADALAT CC Take 1 tablet (30 mg total) by mouth daily. Start taking on: June 23, 2020   oxyCODONE 5 MG immediate release tablet Commonly known as: Oxy IR/ROXICODONE Take 1 tablet (5 mg total) by mouth every 6 (six) hours as needed for up to 4 doses for moderate pain.   Prenatal 28-0.8 MG Tabs Take 1 tablet by mouth daily.   senna-docusate 8.6-50 MG tablet Commonly known as: Senokot-S Take 2 tablets by mouth daily.   simethicone 80 MG chewable tablet Commonly known as: MYLICON Chew 1 tablet (80 mg total) by mouth 3 (three) times daily after meals.        Follow-up Information    [facility] Follow up in 3 day(s).   Why: BP f/u check Contact information: [address] [place] [address] [phone]              Signed:  Randa Ngo, CNM 06/22/2020 5:42 PM

## 2020-06-22 NOTE — Progress Notes (Signed)
Post Partum Day 8/Hospital day 3 Admission date: 06/20/2020 Subjective:   Doing well, no complaints.  Tolerating regular diet, pain with PO meds, voiding and ambulating without difficulty. NO HA, feels much better today.   No CP SOB Fever,Chills, N/V or leg pain; denies nipple or breast pain; no HA change of vision, RUQ/epigastric pain  Objective: BP (!) 134/98 (BP Location: Left Arm) Comment: notify Irving Burton, RN  Pulse 73   Temp 99 F (37.2 C)   Resp 20   Ht 5\' 4"  (1.626 m)   Wt 88.9 kg   LMP 09/15/2019 (Exact Date)   SpO2 99%   BMI 33.64 kg/m    Physical Exam:  General: NAD Breasts: soft/nontender CV: RRR Pulm: nl effort, CTABL Abdomen: soft, NT, BS x 4 Incision: no erythema or drainage Lochia: small Uterine Fundus: fundus firm and 3 fb below umbilicus DVT Evaluation: no cords, ttp LEs; 2+ DTRs, No clonus. 1+ LE edema to mid shin    Recent Labs    06/20/20 1941 06/22/20 1042  HGB 9.6* 9.8*  HCT 28.4* 30.2*  WBC 11.5* 10.0  PLT 281 345    Intake/Output Summary (Last 24 hours) at 06/22/2020 1353 Last data filed at 06/22/2020 1200 Gross per 24 hour  Intake 1697.96 ml  Output 4210 ml  Net -2512.04 ml      Assessment/Plan: 30 y.o. G1P1001 postpartum day # 8 Pre-eclampsia, postpartum   - Continue routine PP care - Labetalol 400mg  PO BID started this am.  - CBC and CMP repeat this am- WNL - d/w Dr 08/22/2020.  - Lactation consult as needed.    Disposition: remain inpt    , CNM 06/22/2020 1:53 PM

## 2020-06-22 NOTE — Progress Notes (Signed)
Patient discharged home. Discharge instructions, prescriptions and follow up appointment given to and reviewed with patient. Patient verbalized understanding. Pt wheeled out by NT 

## 2020-06-22 NOTE — Consult Note (Signed)
BRIEF PHARMACY CONSULT NOTE - INITIAL   Pharmacy Consult for Meds to [facility]   No Known Allergies  Patient Measurements: Height: 5\' 4"  (162.6 cm) Weight: 88.9 kg (196 lb) IBW/kg (Calculated) : 54.7  Vital Signs: Temp: 99 F (37.2 C) (04/01 1132) Temp Source: Oral (04/01 0336) BP: 134/98 (04/01 1132) Pulse Rate: 73 (04/01 1132) Intake/Output from previous day: 03/31 0701 - 04/01 0700 In: 2902.2 [P.O.:1475; I.V.:1427.2] Out: 7300 [Urine:7300] Intake/Output from this shift: Total I/O In: 240 [P.O.:240] Out: -   Labs: Recent Labs    06/20/20 1847 06/20/20 1941 06/22/20 1042  WBC  --  11.5* 10.0  HGB  --  9.6* 9.8*  HCT  --  28.4* 30.2*  PLT  --  281 345  CREATININE  --  0.66 0.81  LABCREA 20  --   --   ALBUMIN  --  2.8* 2.8*  PROT  --  6.0* 6.3*  AST  --  20 19  ALT  --  21 18  ALKPHOS  --  141* 121  BILITOT  --  0.4 0.4   Estimated Creatinine Clearance: 108.7 mL/min (by C-G formula based on SCr of 0.81 mg/dL).   Microbiology:  Medical History: Past Medical History:  Diagnosis Date  . ADHD   . Family history of factor V Leiden mutation    pt has not been tested at this time  . GERD (gastroesophageal reflux disease)   . Gestational diabetes     Assessment/Plan: Pharmacy has been consulted to help assist in Postpartum Meds to [facility] and medication retrieval for 30yo patient post-partum day 8, Hospital stay day 3. I reached out to the patient regarding our program that we offer and how we can assist in delivering the medications directly to the patient.   Both spouse and patient were appreciative of our service but in agreement that they preferred to continue to pick up the discharge medications at their preferred pharmacy([facility] in [place], [place]) and politely declined. I ensured that they had no further questions and had reliable transportation.   Thank you for allowing pharmacy to be a part of this patient's care. Will sign off.  [place],  PharmD Clinical Pharmacist 06/22/2020 12:04 PM

## 2020-06-22 NOTE — Progress Notes (Signed)
Patient ID: Monique [name], female   DOB: [birth year], 30 y.o.   MRN: [id number] Will add Procardia xl 30 mg to her 400 mg labetalol bid dosing . Consider pm d/c or 06/23/20

## 2021-04-24 DIAGNOSIS — M542 Cervicalgia: Secondary | ICD-10-CM | POA: Diagnosis not present

## 2021-04-24 DIAGNOSIS — R293 Abnormal posture: Secondary | ICD-10-CM | POA: Diagnosis not present

## 2021-05-08 DIAGNOSIS — R1903 Right lower quadrant abdominal swelling, mass and lump: Secondary | ICD-10-CM | POA: Diagnosis not present

## 2021-05-08 DIAGNOSIS — Z3009 Encounter for other general counseling and advice on contraception: Secondary | ICD-10-CM | POA: Diagnosis not present

## 2021-05-09 DIAGNOSIS — R293 Abnormal posture: Secondary | ICD-10-CM | POA: Diagnosis not present

## 2021-05-09 DIAGNOSIS — M542 Cervicalgia: Secondary | ICD-10-CM | POA: Diagnosis not present

## 2021-05-16 DIAGNOSIS — R1900 Intra-abdominal and pelvic swelling, mass and lump, unspecified site: Secondary | ICD-10-CM | POA: Diagnosis not present

## 2021-05-17 ENCOUNTER — Other Ambulatory Visit: Payer: Self-pay | Admitting: General Surgery

## 2021-05-17 DIAGNOSIS — R1900 Intra-abdominal and pelvic swelling, mass and lump, unspecified site: Secondary | ICD-10-CM

## 2021-05-29 DIAGNOSIS — F909 Attention-deficit hyperactivity disorder, unspecified type: Secondary | ICD-10-CM | POA: Diagnosis not present

## 2021-05-29 DIAGNOSIS — Z0000 Encounter for general adult medical examination without abnormal findings: Secondary | ICD-10-CM | POA: Diagnosis not present

## 2021-05-29 DIAGNOSIS — Z6829 Body mass index (BMI) 29.0-29.9, adult: Secondary | ICD-10-CM | POA: Diagnosis not present

## 2021-05-29 DIAGNOSIS — Z1331 Encounter for screening for depression: Secondary | ICD-10-CM | POA: Diagnosis not present

## 2021-05-29 DIAGNOSIS — R69 Illness, unspecified: Secondary | ICD-10-CM | POA: Diagnosis not present

## 2021-06-07 ENCOUNTER — Ambulatory Visit: Admission: RE | Admit: 2021-06-07 | Payer: 59 | Source: Home / Self Care

## 2021-06-10 ENCOUNTER — Ambulatory Visit: Payer: 59

## 2021-06-24 ENCOUNTER — Ambulatory Visit
Admission: RE | Admit: 2021-06-24 | Discharge: 2021-06-24 | Disposition: A | Discharge status: Home / Self Care | Payer: 59 | Source: Ambulatory Visit | Attending: General Surgery | Admitting: General Surgery

## 2021-06-24 DIAGNOSIS — R1900 Intra-abdominal and pelvic swelling, mass and lump, unspecified site: Secondary | ICD-10-CM | POA: Insufficient documentation

## 2021-06-24 DIAGNOSIS — K6389 Other specified diseases of intestine: Secondary | ICD-10-CM | POA: Diagnosis not present

## 2021-06-24 DIAGNOSIS — M4317 Spondylolisthesis, lumbosacral region: Secondary | ICD-10-CM | POA: Diagnosis not present

## 2021-06-24 DIAGNOSIS — R160 Hepatomegaly, not elsewhere classified: Secondary | ICD-10-CM | POA: Diagnosis not present

## 2021-06-24 DIAGNOSIS — R140 Abdominal distension (gaseous): Secondary | ICD-10-CM | POA: Diagnosis not present

## 2021-07-23 DIAGNOSIS — R0981 Nasal congestion: Secondary | ICD-10-CM | POA: Diagnosis not present

## 2021-07-23 DIAGNOSIS — H66002 Acute suppurative otitis media without spontaneous rupture of ear drum, left ear: Secondary | ICD-10-CM | POA: Diagnosis not present

## 2021-07-23 DIAGNOSIS — J029 Acute pharyngitis, unspecified: Secondary | ICD-10-CM | POA: Diagnosis not present

## 2021-08-13 DIAGNOSIS — Z3041 Encounter for surveillance of contraceptive pills: Secondary | ICD-10-CM | POA: Diagnosis not present

## 2021-08-23 DIAGNOSIS — R160 Hepatomegaly, not elsewhere classified: Secondary | ICD-10-CM | POA: Diagnosis not present

## 2021-08-23 DIAGNOSIS — R69 Illness, unspecified: Secondary | ICD-10-CM | POA: Diagnosis not present

## 2021-08-23 DIAGNOSIS — J40 Bronchitis, not specified as acute or chronic: Secondary | ICD-10-CM | POA: Diagnosis not present

## 2021-08-26 ENCOUNTER — Other Ambulatory Visit: Payer: Self-pay | Admitting: Internal Medicine

## 2021-08-26 DIAGNOSIS — R160 Hepatomegaly, not elsewhere classified: Secondary | ICD-10-CM

## 2021-10-28 DIAGNOSIS — L308 Other specified dermatitis: Secondary | ICD-10-CM | POA: Diagnosis not present

## 2021-10-28 DIAGNOSIS — D2262 Melanocytic nevi of left upper limb, including shoulder: Secondary | ICD-10-CM | POA: Diagnosis not present

## 2021-10-28 DIAGNOSIS — L821 Other seborrheic keratosis: Secondary | ICD-10-CM | POA: Diagnosis not present

## 2021-10-28 DIAGNOSIS — L718 Other rosacea: Secondary | ICD-10-CM | POA: Diagnosis not present

## 2021-10-28 DIAGNOSIS — D225 Melanocytic nevi of trunk: Secondary | ICD-10-CM | POA: Diagnosis not present

## 2021-10-28 DIAGNOSIS — D2272 Melanocytic nevi of left lower limb, including hip: Secondary | ICD-10-CM | POA: Diagnosis not present

## 2021-10-28 DIAGNOSIS — D2271 Melanocytic nevi of right lower limb, including hip: Secondary | ICD-10-CM | POA: Diagnosis not present

## 2021-10-28 DIAGNOSIS — D2261 Melanocytic nevi of right upper limb, including shoulder: Secondary | ICD-10-CM | POA: Diagnosis not present

## 2021-11-28 DIAGNOSIS — J302 Other seasonal allergic rhinitis: Secondary | ICD-10-CM | POA: Diagnosis not present

## 2021-11-28 DIAGNOSIS — R69 Illness, unspecified: Secondary | ICD-10-CM | POA: Diagnosis not present

## 2021-11-28 DIAGNOSIS — R160 Hepatomegaly, not elsewhere classified: Secondary | ICD-10-CM | POA: Diagnosis not present

## 2022-01-21 ENCOUNTER — Other Ambulatory Visit: Payer: Self-pay | Admitting: Internal Medicine

## 2022-01-21 DIAGNOSIS — R160 Hepatomegaly, not elsewhere classified: Secondary | ICD-10-CM

## 2022-01-28 ENCOUNTER — Other Ambulatory Visit: Payer: 59

## 2022-01-30 ENCOUNTER — Ambulatory Visit
Admission: RE | Admit: 2022-01-30 | Discharge: 2022-01-30 | Disposition: A | Discharge status: Home / Self Care | Payer: 59 | Source: Ambulatory Visit | Attending: Internal Medicine | Admitting: Internal Medicine

## 2022-01-30 DIAGNOSIS — R160 Hepatomegaly, not elsewhere classified: Secondary | ICD-10-CM | POA: Diagnosis not present

## 2022-02-07 ENCOUNTER — Other Ambulatory Visit: Payer: Self-pay | Admitting: Internal Medicine

## 2022-02-07 MED ORDER — AMOXICILLIN-POT CLAVULANATE 875-125 MG PO TABS: 1.0000 | ORAL_TABLET | Freq: Two times a day (BID) | ORAL | 0 refills | Status: AC

## 2022-02-18 ENCOUNTER — Ambulatory Visit: Payer: Self-pay | Admitting: Internal Medicine

## 2022-02-21 DIAGNOSIS — B084 Enteroviral vesicular stomatitis with exanthem: Secondary | ICD-10-CM | POA: Diagnosis not present

## 2022-02-21 DIAGNOSIS — Z20828 Contact with and (suspected) exposure to other viral communicable diseases: Secondary | ICD-10-CM | POA: Diagnosis not present

## 2022-02-21 DIAGNOSIS — Z6828 Body mass index (BMI) 28.0-28.9, adult: Secondary | ICD-10-CM | POA: Diagnosis not present

## 2022-03-10 ENCOUNTER — Ambulatory Visit: Payer: Self-pay | Admitting: Internal Medicine

## 2022-03-14 ENCOUNTER — Other Ambulatory Visit: Payer: Self-pay | Admitting: Internal Medicine

## 2022-03-14 MED ORDER — AMPHETAMINE-DEXTROAMPHETAMINE 30 MG PO TABS: 30.0000 mg | ORAL_TABLET | Freq: Two times a day (BID) | ORAL | 0 refills | Status: DC

## 2022-04-28 DIAGNOSIS — J00 Acute nasopharyngitis [common cold]: Secondary | ICD-10-CM | POA: Diagnosis not present

## 2022-04-28 DIAGNOSIS — Z6829 Body mass index (BMI) 29.0-29.9, adult: Secondary | ICD-10-CM | POA: Diagnosis not present

## 2022-04-28 DIAGNOSIS — Z03818 Encounter for observation for suspected exposure to other biological agents ruled out: Secondary | ICD-10-CM | POA: Diagnosis not present

## 2022-04-28 DIAGNOSIS — Z1152 Encounter for screening for COVID-19: Secondary | ICD-10-CM | POA: Diagnosis not present

## 2022-04-29 ENCOUNTER — Ambulatory Visit: Payer: 59 | Admitting: Internal Medicine

## 2022-05-08 ENCOUNTER — Ambulatory Visit: Payer: 59 | Admitting: Internal Medicine

## 2022-05-08 ENCOUNTER — Encounter: Payer: Self-pay | Admitting: Internal Medicine

## 2022-05-08 VITALS — BP 118/78 | HR 91 | Temp 97.8°F | Resp 16 | Ht 64.0 in | Wt 175.4 lb

## 2022-05-08 DIAGNOSIS — F988 Other specified behavioral and emotional disorders with onset usually occurring in childhood and adolescence: Secondary | ICD-10-CM | POA: Diagnosis not present

## 2022-05-08 DIAGNOSIS — F411 Generalized anxiety disorder: Secondary | ICD-10-CM

## 2022-05-08 MED ORDER — DICYCLOMINE HCL 10 MG PO CAPS: 10.0000 mg | ORAL_CAPSULE | Freq: Three times a day (TID) | ORAL | 1 refills | Status: DC

## 2022-05-08 MED ORDER — BUSPIRONE HCL 5 MG PO TABS: 5.0000 mg | ORAL_TABLET | Freq: Two times a day (BID) | ORAL | 2 refills | Status: DC

## 2022-05-08 MED ORDER — AMPHETAMINE-DEXTROAMPHETAMINE 30 MG PO TABS: 30.0000 mg | ORAL_TABLET | Freq: Two times a day (BID) | ORAL | 0 refills | Status: DC

## 2022-05-08 NOTE — Assessment & Plan Note (Signed)
She states she is having periods of anxiousness a lot but only has a few panic attacks per year.  She has been on xanax in the past but states she wants to try buspar.

## 2022-05-08 NOTE — Assessment & Plan Note (Signed)
The patient seems to be doing well.  We will refill her medications at this time.

## 2022-05-08 NOTE — Progress Notes (Signed)
Office Visit  Subjective   Patient ID: Monique [name]   DOB: 18-Nov-1990   Age: 32 y.o.   MRN: [id number]   Chief Complaint Chief Complaint  Patient presents with   Follow-up    ADHD     History of Present Illness The patient is a 32 yo female who returns today for  followup of her ADD.  Since her last visit, there has been no changes.  She was diagnosed with ADD in 2017 when she was 32 years old.   The patient is currently on Adderall 42m BID and she states this is controlling her symptoms.  She denies any side effects from her medications.  She states that this does control her ADD.  She is able to sit down in class and listen and able to sit down and study and remember what she read.  Again, she has never had a formal evaluation for this in the past but she tells me that she has problems with concentrating and inattention.  This began as a child and has occured most of her life.  She does have problems with concentration and impulsivitiy.  She states her concentration is not that great where he cannot sit down and read a book and he is easily distracted.  She will have to reread a chapter 3-4 times in order to finish it and has done this since she was a child  The patient denies any racing thoughts and she proscrastinates alot.  She fails to pay close attention to details for assignments and has difficulty following through to completion.  She does well with organization skills.  She does fidget by doodling.  However, she admits she does talk excessively at times and has difficulty waiting her turn and interrupts people at times. Otherwise, she denies any depression or anxiety.     Past Medical History Past Medical History:  Diagnosis Date   ADHD    Family history of factor V Leiden mutation    pt has not been tested at this time   GERD (gastroesophageal reflux disease)    Gestational diabetes      Allergies No Known Allergies   Medications  Current Outpatient Medications:     amphetamine-dextroamphetamine (ADDERALL) 30 MG tablet, Take 30 mg by mouth 2 (two) times [name]. Takes 1/2 tab is 15 mg, Disp: , Rfl:    amphetamine-dextroamphetamine (ADDERALL) 30 MG tablet, Take 1 tablet by mouth 2 (two) times [name]., Disp: 60 tablet, Rfl: 0   amphetamine-dextroamphetamine (ADDERALL) 30 MG tablet, Take 1 tablet by mouth 2 (two) times [name]., Disp: 60 tablet, Rfl: 0   [START ON 05/13/2022] amphetamine-dextroamphetamine (ADDERALL) 30 MG tablet, Take 1 tablet by mouth 2 (two) times [name]., Disp: 30 tablet, Rfl: 0   dicyclomine (BENTYL) 10 MG capsule, Take 1 capsule by mouth 3 (three) times [name]., Disp: , Rfl:    diphenhydrAMINE (BENADRYL) 25 mg capsule, Take 1 capsule (25 mg total) by mouth every 6 (six) hours as needed for itching., Disp: 30 capsule, Rfl: 0   famotidine (PEPCID) 20 MG tablet, Take 20 mg by mouth 2 (two) times [name]., Disp: , Rfl:    NIFEdipine (ADALAT CC) 30 MG 24 hr tablet, Take 1 tablet (30 mg total) by mouth [name]., Disp: 30 tablet, Rfl: 0   Review of Systems Review of Systems  Constitutional:  Negative for chills and fever.  Eyes:  Negative for blurred vision.  Respiratory:  Negative for cough and shortness of breath.   Cardiovascular:  Negative for chest pain, palpitations and leg swelling.  Gastrointestinal:  Negative for abdominal pain, constipation, diarrhea, nausea and vomiting.  Musculoskeletal:  Negative for myalgias.  Neurological:  Negative for dizziness, weakness and headaches.       Objective:    Vitals BP 118/78   Pulse 91   Temp 97.8 F (36.6 C)   Resp 16   Ht 5' 4"$  (1.626 m)   Wt 175 lb 6.4 oz (79.6 kg)   SpO2 99%   BMI 30.11 kg/m    Physical Examination Physical Exam Constitutional:      Appearance: Normal appearance. She is not ill-appearing.  Cardiovascular:     Rate and Rhythm: Normal rate and regular rhythm.     Pulses: Normal pulses.     Heart sounds: No murmur heard.    No friction rub. No gallop.  Pulmonary:      Effort: Pulmonary effort is normal. No respiratory distress.     Breath sounds: No wheezing, rhonchi or rales.  Abdominal:     General: Bowel sounds are normal. There is no distension.     Palpations: Abdomen is soft.     Tenderness: There is no abdominal tenderness.  Musculoskeletal:     Right lower leg: No edema.     Left lower leg: No edema.  Skin:    General: Skin is warm and dry.     Findings: No rash.  Neurological:     Mental Status: She is alert.        Assessment & Plan:   Attention deficit disorder The patient seems to be doing well.  We will refill her medications at this time.  GAD (generalized anxiety disorder) She states she is having periods of anxiousness a lot but only has a few panic attacks per year.  She has been on xanax in the past but states she wants to try buspar.    Return in about 3 months (around 08/06/2022) for annual.   Townsend Roger, MD

## 2022-05-14 ENCOUNTER — Other Ambulatory Visit: Payer: Self-pay | Admitting: Internal Medicine

## 2022-05-14 MED ORDER — FLUCONAZOLE 150 MG PO TABS: 150.0000 mg | ORAL_TABLET | Freq: Once | ORAL | 0 refills | Status: AC

## 2022-05-16 DIAGNOSIS — R030 Elevated blood-pressure reading, without diagnosis of hypertension: Secondary | ICD-10-CM | POA: Diagnosis not present

## 2022-05-16 DIAGNOSIS — J069 Acute upper respiratory infection, unspecified: Secondary | ICD-10-CM | POA: Diagnosis not present

## 2022-05-16 DIAGNOSIS — J0141 Acute recurrent pansinusitis: Secondary | ICD-10-CM | POA: Diagnosis not present

## 2022-05-16 DIAGNOSIS — Z6829 Body mass index (BMI) 29.0-29.9, adult: Secondary | ICD-10-CM | POA: Diagnosis not present

## 2022-05-28 DIAGNOSIS — Z8379 Family history of other diseases of the digestive system: Secondary | ICD-10-CM | POA: Diagnosis not present

## 2022-05-28 DIAGNOSIS — Z833 Family history of diabetes mellitus: Secondary | ICD-10-CM | POA: Diagnosis not present

## 2022-05-28 DIAGNOSIS — F902 Attention-deficit hyperactivity disorder, combined type: Secondary | ICD-10-CM | POA: Diagnosis not present

## 2022-05-28 DIAGNOSIS — J302 Other seasonal allergic rhinitis: Secondary | ICD-10-CM | POA: Diagnosis not present

## 2022-05-28 DIAGNOSIS — F411 Generalized anxiety disorder: Secondary | ICD-10-CM | POA: Diagnosis not present

## 2022-05-28 DIAGNOSIS — R030 Elevated blood-pressure reading, without diagnosis of hypertension: Secondary | ICD-10-CM | POA: Diagnosis not present

## 2022-05-28 DIAGNOSIS — K589 Irritable bowel syndrome without diarrhea: Secondary | ICD-10-CM | POA: Diagnosis not present

## 2022-05-28 DIAGNOSIS — L309 Dermatitis, unspecified: Secondary | ICD-10-CM | POA: Diagnosis not present

## 2022-05-31 ENCOUNTER — Other Ambulatory Visit: Payer: Self-pay | Admitting: Internal Medicine

## 2022-07-31 ENCOUNTER — Other Ambulatory Visit: Payer: Self-pay | Admitting: Internal Medicine

## 2022-07-31 MED ORDER — AMPHETAMINE-DEXTROAMPHETAMINE 30 MG PO TABS: 30.0000 mg | ORAL_TABLET | Freq: Two times a day (BID) | ORAL | 0 refills | Status: DC

## 2022-08-22 ENCOUNTER — Other Ambulatory Visit: Payer: Self-pay | Admitting: Internal Medicine

## 2022-08-27 ENCOUNTER — Ambulatory Visit: Payer: 59 | Admitting: Internal Medicine

## 2022-08-27 ENCOUNTER — Encounter: Payer: Self-pay | Admitting: Internal Medicine

## 2022-08-27 VITALS — BP 120/88 | HR 84 | Temp 98.1°F | Resp 16 | Ht 64.0 in | Wt 170.4 lb

## 2022-08-27 DIAGNOSIS — J302 Other seasonal allergic rhinitis: Secondary | ICD-10-CM

## 2022-08-27 DIAGNOSIS — Z0000 Encounter for general adult medical examination without abnormal findings: Secondary | ICD-10-CM | POA: Diagnosis not present

## 2022-08-27 DIAGNOSIS — Z6829 Body mass index (BMI) 29.0-29.9, adult: Secondary | ICD-10-CM | POA: Insufficient documentation

## 2022-08-27 DIAGNOSIS — K581 Irritable bowel syndrome with constipation: Secondary | ICD-10-CM | POA: Insufficient documentation

## 2022-08-27 DIAGNOSIS — F411 Generalized anxiety disorder: Secondary | ICD-10-CM | POA: Diagnosis not present

## 2022-08-27 DIAGNOSIS — F988 Other specified behavioral and emotional disorders with onset usually occurring in childhood and adolescence: Secondary | ICD-10-CM

## 2022-08-27 MED ORDER — AMPHETAMINE-DEXTROAMPHETAMINE 30 MG PO TABS
30.0000 mg | ORAL_TABLET | Freq: Two times a day (BID) | ORAL | 0 refills | Status: DC
Start: 2022-09-26 — End: 2023-07-31

## 2022-08-27 MED ORDER — FLUTICASONE PROPIONATE 50 MCG/ACT NA SUSP: 1.0000 | Freq: Two times a day (BID) | NASAL | 0 refills | Status: DC

## 2022-08-27 MED ORDER — AMPHETAMINE-DEXTROAMPHETAMINE 30 MG PO TABS
30.0000 mg | ORAL_TABLET | Freq: Two times a day (BID) | ORAL | 0 refills | Status: DC
Start: 2022-10-26 — End: 2022-11-19

## 2022-08-27 MED ORDER — BUSPIRONE HCL 5 MG PO TABS: ORAL_TABLET | ORAL | 2 refills | Status: DC

## 2022-08-27 MED ORDER — AMPHETAMINE-DEXTROAMPHETAMINE 30 MG PO TABS
30.0000 mg | ORAL_TABLET | Freq: Two times a day (BID) | ORAL | 0 refills | Status: DC
Start: 2022-08-27 — End: 2023-07-31

## 2022-08-27 NOTE — Progress Notes (Signed)
Office Visit  Subjective   Patient ID: Vincent Kung   DOB: 1990-06-26   Age: 32 y.o.   MRN: [id number]   Chief Complaint Chief Complaint  Patient presents with   Annual Exam     History of Present Illness Alexsus Wehrle is a 32 year old Caucasian/White female who presents for her annual health maintenance exam. She is due for the following health maintenance studies: screening labs. This patient's past medical history ADD.   Her last eye exam was in 2021 and her vision is doing well. There is no family history of colorectal cancer or breast cancer. She has never had a colonoscopy. She delivered her child on 05/2020. They did a PAP smear in 2021 and she states this was normal. She has never had an abnormal PAP. Her dad and his brother have a history of pulmonary embolisms and she was told when she was around age of 15 she needs to be tested for "blood clots". Her brother and father had Factor V Leiden mutations but I tested her for this in 2017 and she does not carry the mutation. She does not smoke. There is no heart disease in the family but her materal great aunt has a history of strokes. She does exercise regularly with yard work. She does not get yearly flu vaccines. She has not had any of the COVID-19 vaccines. There is no depression. She is not on an ASA.   The patient is a 32 yo female who returns today for  followup of her ADD.  Since her last visit, there has been no changes.  She was diagnosed with ADD in 2017 when she was 32 years old.   The patient is currently on Adderall 30mg  BID and she states this is controlling her symptoms.  She denies any side effects from her medications.  She states that this does control her ADD.  She is able to sit down in class and listen and able to sit down and study and remember what she read.  Again, she has never had a formal evaluation for this in the past but she tells me that she has problems with concentrating and inattention.  This began as a  child and has occured most of her life.  She does have problems with concentration and impulsivitiy.  She states her concentration is not that great where he cannot sit down and read a book and he is easily distracted.  She will have to reread a chapter 3-4 times in order to finish it and has done this since she was a child  The patient denies any racing thoughts and she proscrastinates alot.  She fails to pay close attention to details for assignments and has difficulty following through to completion.  She does well with organization skills.  She does fidget by doodling.  However, she admits she does talk excessively at times and has difficulty waiting her turn and interrupts people at times.   The patient is a 32 year old female who returns for followup of her anxiety.  I saw her 3 months ago where she was having periods of anxiousness but very rare panic panic attacks.  She had been on xanax in the past but we started her on buspar 5mg  BID.  Today, she states she has not noticed that this has worked.  She denies any dizziness.  The patient denies any depression. She denies reports difficulty performing routine daily activities, extreme feelings of guilt, feelings of isolation,  insomnia, loss of interest in pleasurable activities, difficulty concentrating, fatigue, feelings of worthlessness, helpless feeling, suicidal ideation, homicidal ideation, weight loss, loss of appetite, social withdrawal, and out of control feelings. This patient feels that she is able to care for herself. She currently lives with her family.  This past year she was also discovered to have hepatomegaly.  She had a CT scan of her abd/pelvis on 06/24/2021 to see if she had a hernia in her prior C-section scar.  This was negative but they noted her liver was enlarged at 18.1 cm.  She stated at that time that she did drink 2 selzters that day but she drinks maybe 2-3 mixed drinks per week but over the interim she stopped drinking mixed  drinks.  I ordered an [place] of her RUQ which was done on 01/30/2022 and this was otherwise normal.    Mrs. Upton also has a possible history of possible IBS with constipation and abdominal pain.  She will occasionally have diarrhea.  I started her on bentryl a few years ago but she is not sure if this med helps but she is taking it twice a day.  She also has a history of seasonal allerigies.  Her allergies effect her in the Spring and summer.  Her symptoms include sneezing and post nasal drip.   She does take allegra 180mg  daily but it does not completely work.       Past Medical History Past Medical History:  Diagnosis Date   ADHD    Family history of factor V Leiden mutation    pt has not been tested at this time   GAD (generalized anxiety disorder)    GERD (gastroesophageal reflux disease)    Gestational diabetes      Allergies No Known Allergies   Medications  Current Outpatient Medications:    amphetamine-dextroamphetamine (ADDERALL) 30 MG tablet, Take 1 tablet by mouth 2 (two) times daily., Disp: 60 tablet, Rfl: 0   [START ON 09/26/2022] amphetamine-dextroamphetamine (ADDERALL) 30 MG tablet, Take 1 tablet by mouth 2 (two) times daily., Disp: 60 tablet, Rfl: 0   [START ON 10/26/2022] amphetamine-dextroamphetamine (ADDERALL) 30 MG tablet, Take 1 tablet by mouth 2 (two) times daily., Disp: 60 tablet, Rfl: 0   fluticasone (FLONASE) 50 MCG/ACT nasal spray, Place 1 spray into both nostrils in the morning and at bedtime., Disp: 15.8 mL, Rfl: 0   busPIRone (BUSPAR) 5 MG tablet, Take 1 tab by mouth three times a day for 1 week, if no improvement;  increase to 10mg  in AM, 5mg  in afternoon and 10mg  in PM., Disp: 150 tablet, Rfl: 2   dicyclomine (BENTYL) 10 MG capsule, TAKE 1 CAPSULE BY MOUTH 3 TIMES DAILY., Disp: 270 capsule, Rfl: 1   famotidine (PEPCID) 20 MG tablet, Take 20 mg by mouth 2 (two) times daily., Disp: , Rfl:    Review of Systems Review of Systems  Constitutional:  Negative for  chills, fever, malaise/fatigue and weight loss.  Eyes:  Negative for blurred vision and double vision.  Respiratory:  Negative for cough, shortness of breath and wheezing.   Cardiovascular:  Negative for chest pain, palpitations and leg swelling.  Gastrointestinal:  Negative for nausea and vomiting.  Skin:  Negative for itching and rash.       Objective:    Vitals BP 120/88   Pulse 84   Temp 98.1 F (36.7 C)   Resp 16   Ht 5\' 4"  (1.626 m)   Wt 170 lb 6.4 oz (77.3  kg)   SpO2 98%   BMI 29.25 kg/m    Physical Examination Physical Exam Constitutional:      Appearance: Normal appearance. She is not ill-appearing.  HENT:     Head: Normocephalic and atraumatic.     Right Ear: Tympanic membrane, ear canal and external ear normal.     Left Ear: Tympanic membrane, ear canal and external ear normal.     Nose: Nose normal. No congestion or rhinorrhea.     Mouth/Throat:     Mouth: Mucous membranes are moist.     Pharynx: Oropharynx is clear. No oropharyngeal exudate or posterior oropharyngeal erythema.  Eyes:     General: No scleral icterus.    Conjunctiva/sclera: Conjunctivae normal.     Pupils: Pupils are equal, round, and reactive to light.  Neck:     Vascular: No carotid bruit.  Cardiovascular:     Rate and Rhythm: Normal rate and regular rhythm.     Pulses: Normal pulses.     Heart sounds: No murmur heard.    No friction rub. No gallop.  Pulmonary:     Effort: Pulmonary effort is normal. No respiratory distress.     Breath sounds: No wheezing, rhonchi or rales.  Abdominal:     General: Bowel sounds are normal. There is no distension.     Palpations: Abdomen is soft.     Tenderness: There is no abdominal tenderness.  Musculoskeletal:     Cervical back: Neck supple. No tenderness.     Right lower leg: No edema.     Left lower leg: No edema.  Lymphadenopathy:     Cervical: No cervical adenopathy.  Skin:    General: Skin is warm and dry.     Findings: No rash.   Neurological:     General: No focal deficit present.     Mental Status: She is alert and oriented to person, place, and time.  Psychiatric:        Mood and Affect: Mood normal.        Behavior: Behavior normal.        Assessment & Plan:   Irritable bowel syndrome with constipation I have given her an IBS diary with foods and symptoms and whether she takes the bentyl.  We will see if the bentyl helps.  I want her to increase her fiber in her diet.    Annual physical exam Health maintenance discussed.  We will obtain some yearly labs.  Attention deficit disorder We will refill her adderall at this time.  This is controlling her ADD.  BMI 29.0-29.9,adult I want her to eat healthy and exercise.  GAD (generalized anxiety disorder) Her buspar is not working as well as she would like.  I am going to increase her buspar from 5mg  BID to TID dosing.  If in one week this is not helping, she can increase the buspar to 10mg  in AM, 5mg  in afternoon, and 10mg  in evening.  Seasonal allergies We will add flonase to her regimen.    Return in about 3 months (around 11/27/2022).   Crist Fat, MD

## 2022-08-27 NOTE — Assessment & Plan Note (Signed)
We will add flonase to her regimen.

## 2022-08-27 NOTE — Assessment & Plan Note (Signed)
I want her to eat healthy and exercise. 

## 2022-08-27 NOTE — Assessment & Plan Note (Signed)
Health maintenance discussed.  We will obtain some yearly labs. 

## 2022-08-27 NOTE — Assessment & Plan Note (Signed)
We will refill her adderall at this time.  This is controlling her ADD.

## 2022-08-27 NOTE — Assessment & Plan Note (Signed)
I have given her an IBS diary with foods and symptoms and whether she takes the bentyl.  We will see if the bentyl helps.  I want her to increase her fiber in her diet.

## 2022-08-27 NOTE — Assessment & Plan Note (Signed)
Her buspar is not working as well as she would like.  I am going to increase her buspar from 5mg  BID to TID dosing.  If in one week this is not helping, she can increase the buspar to 10mg  in AM, 5mg  in afternoon, and 10mg  in evening.

## 2022-08-28 LAB — CBC WITH DIFFERENTIAL/PLATELET
Basophils Absolute: 0.1 10*3/uL (ref 0.0–0.2)
Basos: 1 %
EOS (ABSOLUTE): 0.1 10*3/uL (ref 0.0–0.4)
Eos: 2 %
Hematocrit: 43.4 % (ref 34.0–46.6)
Hemoglobin: 14.3 g/dL (ref 11.1–15.9)
Immature Grans (Abs): 0 10*3/uL (ref 0.0–0.1)
Immature Granulocytes: 0 %
Lymphocytes Absolute: 2.5 10*3/uL (ref 0.7–3.1)
Lymphs: 36 %
MCH: 29.7 pg (ref 26.6–33.0)
MCHC: 32.9 g/dL (ref 31.5–35.7)
MCV: 90 fL (ref 79–97)
Monocytes Absolute: 0.3 10*3/uL (ref 0.1–0.9)
Monocytes: 4 %
Neutrophils Absolute: 3.9 10*3/uL (ref 1.4–7.0)
Neutrophils: 57 %
Platelets: 314 10*3/uL (ref 150–450)
RBC: 4.81 x10E6/uL (ref 3.77–5.28)
RDW: 12.1 % (ref 11.7–15.4)
WBC: 6.9 10*3/uL (ref 3.4–10.8)

## 2022-08-28 LAB — CMP14 + ANION GAP
ALT: 22 IU/L (ref 0–32)
AST: 19 IU/L (ref 0–40)
Albumin/Globulin Ratio: 2 (ref 1.2–2.2)
Albumin: 4.6 g/dL (ref 3.9–4.9)
Alkaline Phosphatase: 64 IU/L (ref 44–121)
Anion Gap: 14 mmol/L (ref 10.0–18.0)
BUN/Creatinine Ratio: 13 (ref 9–23)
BUN: 10 mg/dL (ref 6–20)
Bilirubin Total: 0.5 mg/dL (ref 0.0–1.2)
CO2: 23 mmol/L (ref 20–29)
Calcium: 9.9 mg/dL (ref 8.7–10.2)
Chloride: 104 mmol/L (ref 96–106)
Creatinine, Ser: 0.75 mg/dL (ref 0.57–1.00)
Globulin, Total: 2.3 g/dL (ref 1.5–4.5)
Glucose: 88 mg/dL (ref 70–99)
Potassium: 4.6 mmol/L (ref 3.5–5.2)
Sodium: 141 mmol/L (ref 134–144)
Total Protein: 6.9 g/dL (ref 6.0–8.5)
eGFR: 108 mL/min/{1.73_m2} (ref >59)

## 2022-08-28 LAB — LIPID PANEL
Chol/HDL Ratio: 2.6 ratio (ref 0.0–4.4)
Cholesterol, Total: 185 mg/dL (ref 100–199)
HDL: 72 mg/dL (ref >39)
LDL Chol Calc (NIH): 100 mg/dL — ABNORMAL HIGH (ref 0–99)
Triglycerides: 68 mg/dL (ref 0–149)
VLDL Cholesterol Cal: 13 mg/dL (ref 5–40)

## 2022-08-28 LAB — VITAMIN D 25 HYDROXY (VIT D DEFICIENCY, FRACTURES): Vit D, 25-Hydroxy: 42.8 ng/mL (ref 30.0–100.0)

## 2022-08-28 LAB — HEMOGLOBIN A1C
Est. average glucose Bld gHb Est-mCnc: 100 mg/dL
Hgb A1c MFr Bld: 5.1 % (ref 4.8–5.6)

## 2022-08-28 LAB — TSH: TSH: 0.891 u[IU]/mL (ref 0.450–4.500)

## 2022-09-04 ENCOUNTER — Telehealth: Payer: Self-pay

## 2022-09-04 NOTE — Telephone Encounter (Signed)
Pt notified of lab results

## 2022-09-04 NOTE — Telephone Encounter (Signed)
-----   Message from Crist Fat, MD sent at 09/02/2022  9:24 AM EDT ----- Her labs look good.

## 2022-09-15 ENCOUNTER — Other Ambulatory Visit: Payer: Self-pay

## 2022-09-15 MED ORDER — AMOXICILLIN-POT CLAVULANATE 875-125 MG PO TABS: 1.0000 | ORAL_TABLET | Freq: Two times a day (BID) | ORAL | 0 refills | Status: AC

## 2022-09-24 ENCOUNTER — Other Ambulatory Visit: Payer: Self-pay | Admitting: Internal Medicine

## 2022-09-24 DIAGNOSIS — Z0000 Encounter for general adult medical examination without abnormal findings: Secondary | ICD-10-CM

## 2022-09-24 DIAGNOSIS — Z6829 Body mass index (BMI) 29.0-29.9, adult: Secondary | ICD-10-CM

## 2022-10-03 ENCOUNTER — Telehealth: Payer: 59 | Admitting: Internal Medicine

## 2022-10-03 DIAGNOSIS — J0100 Acute maxillary sinusitis, unspecified: Secondary | ICD-10-CM | POA: Diagnosis not present

## 2022-10-03 MED ORDER — CEFPODOXIME PROXETIL 200 MG PO TABS: 200.0000 mg | ORAL_TABLET | Freq: Two times a day (BID) | ORAL | 0 refills | Status: AC

## 2022-10-03 NOTE — Progress Notes (Signed)
Office Visit  Subjective   Patient ID: Monique [name]   DOB: 01-04-1991   Age: 32 y.o.   MRN: [id number]   Chief Complaint No chief complaint on file.    History of Present Illness The patient is a 32 year old Caucasian/White female who calls in as a telehealth with upper respiratory tract symptoms which began 1 day ago.  She called me about 3 weeks ago where she was have yellow nasal discharge and I called in augmentin which she completed on 09/26/2022.  She did improve but yesterday began having sinus congestion with yellow nasal discharge with post nasal drip with sore throat and cough productive of yellow sputum.  She also has a history of seasonal allerigies.  Her allergies effect her in the Spring and summer.  Her symptoms include sneezing and post nasal drip.   She does take allegra 180mg  daily.  She mucinex sinus max and flonase nasal spray.  She had myaglias last night and she does have sinus headaches.  She denies fever, chills, nausea, vomiting, diarrhea, SOB, chest congestion or wheezing. The patient's past medical history is notable for a history of allergies.      Past Medical History Past Medical History:  Diagnosis Date   ADHD    Family history of factor V Leiden mutation    pt has not been tested at this time   GAD (generalized anxiety disorder)    GERD (gastroesophageal reflux disease)    Gestational diabetes      Allergies No Known Allergies   Medications  Current Outpatient Medications:    amphetamine-dextroamphetamine (ADDERALL) 30 MG tablet, Take 1 tablet by mouth 2 (two) times daily., Disp: 60 tablet, Rfl: 0   amphetamine-dextroamphetamine (ADDERALL) 30 MG tablet, Take 1 tablet by mouth 2 (two) times daily., Disp: 60 tablet, Rfl: 0   [START ON 10/26/2022] amphetamine-dextroamphetamine (ADDERALL) 30 MG tablet, Take 1 tablet by mouth 2 (two) times daily., Disp: 60 tablet, Rfl: 0   busPIRone (BUSPAR) 5 MG tablet, Take 1 tab by mouth three times a day for 1 week,  if no improvement;  increase to 10mg  in AM, 5mg  in afternoon and 10mg  in PM., Disp: 150 tablet, Rfl: 2   dicyclomine (BENTYL) 10 MG capsule, TAKE 1 CAPSULE BY MOUTH 3 TIMES DAILY., Disp: 270 capsule, Rfl: 1   famotidine (PEPCID) 20 MG tablet, Take 20 mg by mouth 2 (two) times daily., Disp: , Rfl:    fluticasone (FLONASE) 50 MCG/ACT nasal spray, Place 1 spray into both nostrils in the morning and at bedtime. Dx:J30.2, Disp: 48 mL, Rfl: 1   Review of Systems Review of Systems  Constitutional:  Negative for chills and fever.  HENT:  Positive for congestion, ear pain, sinus pain and sore throat.   Respiratory:  Positive for cough and sputum production. Negative for shortness of breath and wheezing.   Gastrointestinal:  Negative for constipation, diarrhea, nausea and vomiting.  Neurological:  Positive for dizziness. Negative for weakness and headaches.       Objective:    Vitals There were no vitals taken for this visit.   Physical Examination Physical Exam Constitutional:      Appearance: Normal appearance.  Neurological:     Mental Status: She is alert.  Psychiatric:        Mood and Affect: Mood normal.        Behavior: Behavior normal.        Assessment & Plan:   Acute non-recurrent maxillary sinusitis She took  augmentin and finished it a week ago and now has another sinus infection.  She wants to be referred to ENT for evaluation.  I want to use a neti pot and we will start her on vantin.  Continue on flonase and allegra.    No follow-ups on file.   Crist Fat, MD

## 2022-10-03 NOTE — Assessment & Plan Note (Signed)
She took augmentin and finished it a week ago and now has another sinus infection.  She wants to be referred to ENT for evaluation.  I want to use a neti pot and we will start her on vantin.  Continue on flonase and allegra.

## 2022-10-27 DIAGNOSIS — J320 Chronic maxillary sinusitis: Secondary | ICD-10-CM | POA: Diagnosis not present

## 2022-10-27 DIAGNOSIS — J342 Deviated nasal septum: Secondary | ICD-10-CM | POA: Diagnosis not present

## 2022-10-27 DIAGNOSIS — J309 Allergic rhinitis, unspecified: Secondary | ICD-10-CM | POA: Diagnosis not present

## 2022-10-27 DIAGNOSIS — J3489 Other specified disorders of nose and nasal sinuses: Secondary | ICD-10-CM | POA: Diagnosis not present

## 2022-11-04 DIAGNOSIS — D225 Melanocytic nevi of trunk: Secondary | ICD-10-CM | POA: Diagnosis not present

## 2022-11-04 DIAGNOSIS — D2262 Melanocytic nevi of left upper limb, including shoulder: Secondary | ICD-10-CM | POA: Diagnosis not present

## 2022-11-04 DIAGNOSIS — L821 Other seborrheic keratosis: Secondary | ICD-10-CM | POA: Diagnosis not present

## 2022-11-04 DIAGNOSIS — L308 Other specified dermatitis: Secondary | ICD-10-CM | POA: Diagnosis not present

## 2022-11-04 DIAGNOSIS — L718 Other rosacea: Secondary | ICD-10-CM | POA: Diagnosis not present

## 2022-11-04 DIAGNOSIS — D2271 Melanocytic nevi of right lower limb, including hip: Secondary | ICD-10-CM | POA: Diagnosis not present

## 2022-11-04 DIAGNOSIS — D2261 Melanocytic nevi of right upper limb, including shoulder: Secondary | ICD-10-CM | POA: Diagnosis not present

## 2022-11-04 DIAGNOSIS — D2272 Melanocytic nevi of left lower limb, including hip: Secondary | ICD-10-CM | POA: Diagnosis not present

## 2022-11-06 DIAGNOSIS — J320 Chronic maxillary sinusitis: Secondary | ICD-10-CM | POA: Diagnosis not present

## 2022-11-19 ENCOUNTER — Other Ambulatory Visit: Payer: Self-pay | Admitting: Internal Medicine

## 2022-11-19 DIAGNOSIS — Z6829 Body mass index (BMI) 29.0-29.9, adult: Secondary | ICD-10-CM

## 2022-11-19 DIAGNOSIS — Z0000 Encounter for general adult medical examination without abnormal findings: Secondary | ICD-10-CM

## 2022-11-19 MED ORDER — AMPHETAMINE-DEXTROAMPHETAMINE 30 MG PO TABS
30.0000 mg | ORAL_TABLET | Freq: Two times a day (BID) | ORAL | 0 refills | Status: DC
Start: 2022-11-19 — End: 2023-11-26

## 2022-11-19 MED ORDER — DOXYCYCLINE MONOHYDRATE 100 MG PO CAPS: 100.0000 mg | ORAL_CAPSULE | Freq: Two times a day (BID) | ORAL | 0 refills | Status: AC

## 2022-11-25 DIAGNOSIS — J301 Allergic rhinitis due to pollen: Secondary | ICD-10-CM | POA: Diagnosis not present

## 2022-11-27 ENCOUNTER — Ambulatory Visit: Payer: 59 | Admitting: Internal Medicine

## 2022-11-28 DIAGNOSIS — J309 Allergic rhinitis, unspecified: Secondary | ICD-10-CM | POA: Diagnosis not present

## 2022-11-28 DIAGNOSIS — J320 Chronic maxillary sinusitis: Secondary | ICD-10-CM | POA: Diagnosis not present

## 2022-12-09 ENCOUNTER — Encounter: Payer: Self-pay | Admitting: Internal Medicine

## 2022-12-09 ENCOUNTER — Ambulatory Visit: Payer: 59 | Admitting: Internal Medicine

## 2022-12-09 VITALS — BP 116/66 | HR 100 | Temp 98.3°F | Resp 18 | Ht 64.0 in | Wt 169.2 lb

## 2022-12-09 DIAGNOSIS — F411 Generalized anxiety disorder: Secondary | ICD-10-CM

## 2022-12-09 DIAGNOSIS — F902 Attention-deficit hyperactivity disorder, combined type: Secondary | ICD-10-CM | POA: Diagnosis not present

## 2022-12-09 MED ORDER — AMPHETAMINE-DEXTROAMPHETAMINE 30 MG PO TABS: 30.0000 mg | ORAL_TABLET | Freq: Two times a day (BID) | ORAL | 0 refills | Status: DC

## 2022-12-09 MED ORDER — BUSPIRONE HCL 7.5 MG PO TABS: 7.5000 mg | ORAL_TABLET | Freq: Two times a day (BID) | ORAL | 1 refills | Status: DC

## 2022-12-09 NOTE — Assessment & Plan Note (Signed)
I want her to increase her buspar from 5mg  BID to 7.5mg  BID.

## 2022-12-09 NOTE — Progress Notes (Signed)
Office Visit  Subjective   Patient ID: Monique [name]   DOB: Nov 02, 1990   Age: 32 y.o.   MRN: [id number]   Chief Complaint Chief Complaint  Patient presents with   Follow-up     History of Present Illness The patient is a 32 yo female who returns today for  followup of her ADD.  Over the last 3 months,  there has been no changes to her ADD.  She was diagnosed with ADD in 2017 when she was 32 years old.   The patient is currently on Adderall 30mg  BID and she states this is controlling her symptoms.  She denies any side effects from her medications.  She states that this does control her ADD.  She is able to sit down in class and listen and able to sit down and study and remember what she read.  Again, she has never had a formal evaluation for this in the past but she tells me that she has problems with concentrating and inattention.  This began as a child and has occured most of her life.  She does have problems with concentration and impulsivitiy.  She states her concentration is not that great where he cannot sit down and read a book and he is easily distracted.  She will have to reread a chapter 3-4 times in order to finish it and has done this since she was a child  The patient denies any racing thoughts and she proscrastinates alot.  She fails to pay close attention to details for assignments and has difficulty following through to completion.  She does well with organization skills.  She does fidget by doodling.  However, she admits she does talk excessively at times and has difficulty waiting her turn and interrupts people at times.    The patient is a 32 year old female who returns for followup of her anxiety.  On her last visit, her anxiety worsened and I wanted to increase her buspar from 5mg  BID to TID.  However, she admits today she forgets to take it TID.  She has tried going to 10mg  BID but this causes some dizziness.  She is still rarely having panic attacks.  She had been on xanax in  the past but we started her on buspar 5mg  BID.  The patient denies any depression. She denies reports difficulty performing routine daily activities, extreme feelings of guilt, feelings of isolation, insomnia, loss of interest in pleasurable activities, difficulty concentrating, fatigue, feelings of worthlessness, helpless feeling, suicidal ideation, homicidal ideation, weight loss, loss of appetite, social withdrawal, and out of control feelings. This patient feels that she is able to care for herself. She currently lives with her family.      Past Medical History Past Medical History:  Diagnosis Date   ADHD    Family history of factor V Leiden mutation    pt has not been tested at this time   GAD (generalized anxiety disorder)    GERD (gastroesophageal reflux disease)    Gestational diabetes      Allergies No Known Allergies   Medications  Current Outpatient Medications:    amphetamine-dextroamphetamine (ADDERALL) 30 MG tablet, Take 1 tablet by mouth 2 (two) times daily., Disp: 60 tablet, Rfl: 0   amphetamine-dextroamphetamine (ADDERALL) 30 MG tablet, Take 1 tablet by mouth 2 (two) times daily., Disp: 60 tablet, Rfl: 0   amphetamine-dextroamphetamine (ADDERALL) 30 MG tablet, Take 1 tablet by mouth 2 (two) times daily., Disp: 60 tablet, Rfl: 0  famotidine (PEPCID) 20 MG tablet, Take 20 mg by mouth 2 (two) times daily., Disp: , Rfl:    fluticasone (FLONASE) 50 MCG/ACT nasal spray, Place 1 spray into both nostrils in the morning and at bedtime. Dx:J30.2, Disp: 48 mL, Rfl: 1   Review of Systems Review of Systems  Constitutional:  Negative for chills and fever.  Eyes:  Negative for blurred vision.  Respiratory:  Negative for shortness of breath.   Cardiovascular:  Negative for chest pain and palpitations.  Gastrointestinal:  Negative for abdominal pain, constipation, diarrhea, nausea and vomiting.  Skin:  Negative for itching and rash.  Neurological:  Negative for dizziness,  weakness and headaches.       Objective:    Vitals BP 116/66   Pulse 100   Temp 98.3 F (36.8 C)   Resp 18   Ht 5\' 4"  (1.626 m)   Wt 169 lb 3.2 oz (76.7 kg)   SpO2 99%   BMI 29.04 kg/m    Physical Examination Physical Exam Constitutional:      Appearance: Normal appearance. She is not ill-appearing.  Cardiovascular:     Rate and Rhythm: Normal rate and regular rhythm.     Pulses: Normal pulses.     Heart sounds: No murmur heard.    No friction rub. No gallop.  Pulmonary:     Effort: Pulmonary effort is normal. No respiratory distress.     Breath sounds: No wheezing, rhonchi or rales.  Abdominal:     General: Bowel sounds are normal. There is no distension.     Palpations: Abdomen is soft.     Tenderness: There is no abdominal tenderness.  Musculoskeletal:     Right lower leg: No edema.     Left lower leg: No edema.  Skin:    General: Skin is warm and dry.     Findings: No rash.  Neurological:     General: No focal deficit present.     Mental Status: She is alert and oriented to person, place, and time.  Psychiatric:        Mood and Affect: Mood normal.        Behavior: Behavior normal.        Assessment & Plan:   GAD (generalized anxiety disorder) I want her to increase her buspar from 5mg  BID to 7.5mg  BID.    Attention deficit disorder We will refill her ADD medications today.  This seems stable.    Return in about 3 months (around 03/10/2023).   Crist Fat, MD

## 2022-12-09 NOTE — Assessment & Plan Note (Signed)
We will refill her ADD medications today.  This seems stable.

## 2023-03-06 ENCOUNTER — Ambulatory Visit: Payer: 59 | Admitting: Internal Medicine

## 2023-03-11 ENCOUNTER — Encounter: Payer: Self-pay | Admitting: Internal Medicine

## 2023-03-11 ENCOUNTER — Ambulatory Visit: Payer: 59 | Admitting: Internal Medicine

## 2023-03-11 VITALS — BP 120/76 | HR 120 | Temp 98.7°F | Resp 18 | Ht 64.0 in | Wt 175.6 lb

## 2023-03-11 DIAGNOSIS — E66811 Obesity, class 1: Secondary | ICD-10-CM | POA: Diagnosis not present

## 2023-03-11 DIAGNOSIS — F988 Other specified behavioral and emotional disorders with onset usually occurring in childhood and adolescence: Secondary | ICD-10-CM | POA: Diagnosis not present

## 2023-03-11 DIAGNOSIS — Z6830 Body mass index (BMI) 30.0-30.9, adult: Secondary | ICD-10-CM | POA: Diagnosis not present

## 2023-03-11 DIAGNOSIS — F411 Generalized anxiety disorder: Secondary | ICD-10-CM | POA: Diagnosis not present

## 2023-03-11 DIAGNOSIS — E6609 Other obesity due to excess calories: Secondary | ICD-10-CM | POA: Insufficient documentation

## 2023-03-11 MED ORDER — AMPHETAMINE-DEXTROAMPHETAMINE 30 MG PO TABS: 30.0000 mg | ORAL_TABLET | Freq: Two times a day (BID) | ORAL | 0 refills | Status: DC

## 2023-03-11 MED ORDER — PHENTERMINE HCL 37.5 MG PO TABS: 37.5000 mg | ORAL_TABLET | Freq: Every day | ORAL | 1 refills | Status: DC

## 2023-03-11 NOTE — Assessment & Plan Note (Signed)
We will refill her adderall today.

## 2023-03-11 NOTE — Assessment & Plan Note (Signed)
Plan as below.  Our goal will be to lose 3-4 lbs per month.

## 2023-03-11 NOTE — Progress Notes (Signed)
Office Visit  Subjective   Patient ID: Monique [name]   DOB: 07-Feb-1991   Age: 32 y.o.   MRN: [id number]   Chief Complaint Chief Complaint  Patient presents with   Follow-up     History of Present Illness The patient is a 32 yo female who returns today for  followup of her ADD.  Today, she states that her ADD is controlled and no change.  She was diagnosed with ADD in 2017 when she was 32 years old.   The patient is currently on Adderall 30mg  BID and she states this is controlling her symptoms.  She denies any side effects from her medications.  She states that this does control her ADD.  She is able to sit down in class and listen and able to sit down and study and remember what she read.  Again, she has never had a formal evaluation for this in the past but she tells me that she has problems with concentrating and inattention.  This began as a child and has occured most of her life.  She does have problems with concentration and impulsivitiy.  She states her concentration is not that great where he cannot sit down and read a book and he is easily distracted.  She will have to reread a chapter 3-4 times in order to finish it and has done this since she was a child  The patient denies any racing thoughts and she proscrastinates alot.  She fails to pay close attention to details for assignments and has difficulty following through to completion.  She does well with organization skills.  She does fidget by doodling.  However, she admits she does talk excessively at times and has difficulty waiting her turn and interrupts people at times.    The patient is a 32 year old female who returns for followup of her anxiety.  On her last visit, her anxiety worsened and I increased her buspar from 5mg  BID to 7.5mg  BID.  She states her anxiety is mild and seems to be doing well.  She will have occasional dizzy spells which she states are rare.  She is still rarely having panic attacks.  She had been on xanax in  the past but we started her on buspar 5mg  BID.  The patient denies any depression. She denies reports difficulty performing routine daily activities, extreme feelings of guilt, feelings of isolation, insomnia, loss of interest in pleasurable activities, difficulty concentrating, fatigue, feelings of worthlessness, helpless feeling, suicidal ideation, homicidal ideation, weight loss, loss of appetite, social withdrawal, and out of control feelings. This patient feels that she is able to care for herself. She currently lives with her family.      Past Medical History Past Medical History:  Diagnosis Date   ADHD    Family history of factor V Leiden mutation    pt has not been tested at this time   GAD (generalized anxiety disorder)    GERD (gastroesophageal reflux disease)    Gestational diabetes      Allergies No Known Allergies   Medications  Current Outpatient Medications:    amphetamine-dextroamphetamine (ADDERALL) 30 MG tablet, Take 1 tablet by mouth 2 (two) times daily., Disp: 60 tablet, Rfl: 0   [START ON 04/10/2023] amphetamine-dextroamphetamine (ADDERALL) 30 MG tablet, Take 1 tablet by mouth 2 (two) times daily., Disp: 60 tablet, Rfl: 0   [START ON 05/10/2023] amphetamine-dextroamphetamine (ADDERALL) 30 MG tablet, Take 1 tablet by mouth 2 (two) times daily., Disp: 60  tablet, Rfl: 0   phentermine (ADIPEX-P) 37.5 MG tablet, Take 1 tablet (37.5 mg total) by mouth daily before breakfast., Disp: 30 tablet, Rfl: 1   amphetamine-dextroamphetamine (ADDERALL) 30 MG tablet, Take 1 tablet by mouth 2 (two) times daily., Disp: 60 tablet, Rfl: 0   amphetamine-dextroamphetamine (ADDERALL) 30 MG tablet, Take 1 tablet by mouth 2 (two) times daily., Disp: 60 tablet, Rfl: 0   amphetamine-dextroamphetamine (ADDERALL) 30 MG tablet, Take 1 tablet by mouth 2 (two) times daily., Disp: 60 tablet, Rfl: 0   amphetamine-dextroamphetamine (ADDERALL) 30 MG tablet, Take 1 tablet by mouth 2 (two) times daily.,  Disp: 60 tablet, Rfl: 0   amphetamine-dextroamphetamine (ADDERALL) 30 MG tablet, Take 1 tablet by mouth 2 (two) times daily., Disp: 60 tablet, Rfl: 0   amphetamine-dextroamphetamine (ADDERALL) 30 MG tablet, Take 1 tablet by mouth 2 (two) times daily., Disp: 60 tablet, Rfl: 0   busPIRone (BUSPAR) 7.5 MG tablet, Take 1 tablet (7.5 mg total) by mouth 2 (two) times daily., Disp: 180 tablet, Rfl: 1   famotidine (PEPCID) 20 MG tablet, Take 20 mg by mouth 2 (two) times daily., Disp: , Rfl:    fluticasone (FLONASE) 50 MCG/ACT nasal spray, Place 1 spray into both nostrils in the morning and at bedtime. Dx:J30.2, Disp: 48 mL, Rfl: 1   Review of Systems Review of Systems  Constitutional:  Negative for chills and fever.  Eyes:  Negative for blurred vision and double vision.  Respiratory:  Negative for shortness of breath.   Cardiovascular:  Negative for chest pain, palpitations and leg swelling.  Gastrointestinal:  Negative for abdominal pain, constipation, diarrhea, nausea and vomiting.  Skin:  Negative for itching and rash.  Neurological:  Negative for dizziness, weakness and headaches.       Objective:    Vitals BP 120/76   Pulse (!) 120   Temp 98.7 F (37.1 C)   Resp 18   Ht 5\' 4"  (1.626 m)   Wt 175 lb 9.6 oz (79.7 kg)   SpO2 99%   BMI 30.14 kg/m    Physical Examination Physical Exam Constitutional:      Appearance: Normal appearance. She is not ill-appearing.  Cardiovascular:     Rate and Rhythm: Normal rate and regular rhythm.     Pulses: Normal pulses.     Heart sounds: No murmur heard.    No friction rub. No gallop.  Pulmonary:     Effort: Pulmonary effort is normal. No respiratory distress.     Breath sounds: No wheezing, rhonchi or rales.  Abdominal:     General: Bowel sounds are normal. There is no distension.     Palpations: Abdomen is soft.     Tenderness: There is no abdominal tenderness.  Musculoskeletal:     Right lower leg: No edema.     Left lower leg: No  edema.  Skin:    General: Skin is warm and dry.     Findings: No rash.  Neurological:     General: No focal deficit present.     Mental Status: She is alert and oriented to person, place, and time.        Assessment & Plan:   GAD (generalized anxiety disorder) We will continue her on buspar.  BMI 30.0-30.9,adult We will start her on adipex 37.5mg  po daily.  I want her to eat healthy and exercise.  Class 1 obesity due to excess calories without serious comorbidity with body mass index (BMI) of 30.0 to 30.9 in adult Plan  as below.  Our goal will be to lose 3-4 lbs per month.  Attention deficit disorder We will refill her adderall today.    Return in about 8 weeks (around 05/06/2023).   Crist Fat, MD

## 2023-03-11 NOTE — Assessment & Plan Note (Signed)
We will start her on adipex 37.5mg  po daily.  I want her to eat healthy and exercise.

## 2023-03-11 NOTE — Assessment & Plan Note (Signed)
We will continue her on buspar.

## 2023-03-17 DIAGNOSIS — Z01419 Encounter for gynecological examination (general) (routine) without abnormal findings: Secondary | ICD-10-CM | POA: Diagnosis not present

## 2023-04-16 DIAGNOSIS — R051 Acute cough: Secondary | ICD-10-CM | POA: Diagnosis not present

## 2023-04-16 DIAGNOSIS — J324 Chronic pansinusitis: Secondary | ICD-10-CM | POA: Diagnosis not present

## 2023-04-16 DIAGNOSIS — J0140 Acute pansinusitis, unspecified: Secondary | ICD-10-CM | POA: Diagnosis not present

## 2023-04-16 DIAGNOSIS — J029 Acute pharyngitis, unspecified: Secondary | ICD-10-CM | POA: Diagnosis not present

## 2023-04-16 DIAGNOSIS — R0981 Nasal congestion: Secondary | ICD-10-CM | POA: Diagnosis not present

## 2023-04-16 DIAGNOSIS — H6993 Unspecified Eustachian tube disorder, bilateral: Secondary | ICD-10-CM | POA: Diagnosis not present

## 2023-04-16 DIAGNOSIS — R599 Enlarged lymph nodes, unspecified: Secondary | ICD-10-CM | POA: Diagnosis not present

## 2023-04-16 DIAGNOSIS — Z6828 Body mass index (BMI) 28.0-28.9, adult: Secondary | ICD-10-CM | POA: Diagnosis not present

## 2023-04-16 DIAGNOSIS — J302 Other seasonal allergic rhinitis: Secondary | ICD-10-CM | POA: Diagnosis not present

## 2023-05-02 ENCOUNTER — Other Ambulatory Visit: Payer: Self-pay | Admitting: Internal Medicine

## 2023-05-06 ENCOUNTER — Ambulatory Visit: Payer: 59 | Admitting: Internal Medicine

## 2023-05-06 ENCOUNTER — Encounter: Payer: Self-pay | Admitting: Internal Medicine

## 2023-05-06 VITALS — BP 114/72 | HR 74 | Temp 98.6°F | Resp 18 | Ht 64.0 in | Wt 170.4 lb

## 2023-05-06 DIAGNOSIS — J302 Other seasonal allergic rhinitis: Secondary | ICD-10-CM | POA: Diagnosis not present

## 2023-05-06 DIAGNOSIS — F988 Other specified behavioral and emotional disorders with onset usually occurring in childhood and adolescence: Secondary | ICD-10-CM

## 2023-05-06 DIAGNOSIS — F411 Generalized anxiety disorder: Secondary | ICD-10-CM | POA: Diagnosis not present

## 2023-05-06 MED ORDER — AMPHETAMINE-DEXTROAMPHETAMINE 30 MG PO TABS
30.0000 mg | ORAL_TABLET | Freq: Two times a day (BID) | ORAL | 0 refills | Status: DC
Start: 2023-07-05 — End: 2023-07-31

## 2023-05-06 MED ORDER — AMPHETAMINE-DEXTROAMPHETAMINE 30 MG PO TABS
30.0000 mg | ORAL_TABLET | Freq: Two times a day (BID) | ORAL | 0 refills | Status: DC
Start: 2023-06-05 — End: 2023-07-31

## 2023-05-06 MED ORDER — ALPRAZOLAM 0.25 MG PO TABS: 0.2500 mg | ORAL_TABLET | Freq: Every day | ORAL | 0 refills | Status: DC | PRN

## 2023-05-06 MED ORDER — AMPHETAMINE-DEXTROAMPHETAMINE 30 MG PO TABS
30.0000 mg | ORAL_TABLET | Freq: Two times a day (BID) | ORAL | 0 refills | Status: DC
Start: 2023-05-06 — End: 2023-07-31

## 2023-05-06 NOTE — Progress Notes (Signed)
Office Visit  Subjective   Patient ID: Monique [name]   DOB: May 13, 1990   Age: 33 y.o.   MRN: [id number]   Chief Complaint Chief Complaint  Patient presents with   Follow-up     History of Present Illness The patient is a 33 yo female who returns today for  followup of her ADD.  Since her last visit, there has been on change to her ADD.  She was diagnosed with ADD in 2017 when she was 33 years old.   The patient is currently on Adderall 30mg  BID and she states this is controlling her symptoms.  She denies any side effects from her medications.  She states that this does control her ADD.  She is able to sit down in class and listen and able to sit down and study and remember what she read.  Again, she has never had a formal evaluation for this in the past but she tells me that she has problems with concentrating and inattention.  This began as a child and has occured most of her life.  She does have problems with concentration and impulsivitiy.  She states her concentration is not that great where he cannot sit down and read a book and he is easily distracted.  She will have to reread a chapter 3-4 times in order to finish it and has done this since she was a child  The patient denies any racing thoughts and she proscrastinates alot.  She fails to pay close attention to details for assignments and has difficulty following through to completion.  She does well with organization skills.  She does fidget by doodling.  However, she admits she does talk excessively at times and has difficulty waiting her turn and interrupts people at times.    The patient is a 33 year old female who returns for followup of her anxiety.  Today, she states she has not taken her buspar for 3 weeks and she states her anxiety has been stable.  She was previously on  7.5mg  BID.  She states her anxiety is mild and seems to be doing well.  She will get occasional panic attacks but this is rare.   The patient denies any  depression. She denies reports difficulty performing routine daily activities, extreme feelings of guilt, feelings of isolation, insomnia, loss of interest in pleasurable activities, difficulty concentrating, fatigue, feelings of worthlessness, helpless feeling, suicidal ideation, homicidal ideation, weight loss, loss of appetite, social withdrawal, and out of control feelings. This patient feels that she is able to care for herself. She currently lives with her family.        Past Medical History Past Medical History:  Diagnosis Date   ADHD    Family history of factor V Leiden mutation    pt has not been tested at this time   GAD (generalized anxiety disorder)    GERD (gastroesophageal reflux disease)    Gestational diabetes      Allergies No Known Allergies   Medications  Current Outpatient Medications:    amphetamine-dextroamphetamine (ADDERALL) 30 MG tablet, Take 1 tablet by mouth 2 (two) times daily., Disp: 60 tablet, Rfl: 0   amphetamine-dextroamphetamine (ADDERALL) 30 MG tablet, Take 1 tablet by mouth 2 (two) times daily., Disp: 60 tablet, Rfl: 0   amphetamine-dextroamphetamine (ADDERALL) 30 MG tablet, Take 1 tablet by mouth 2 (two) times daily., Disp: 60 tablet, Rfl: 0   amphetamine-dextroamphetamine (ADDERALL) 30 MG tablet, Take 1 tablet by mouth 2 (two) times  daily., Disp: 60 tablet, Rfl: 0   amphetamine-dextroamphetamine (ADDERALL) 30 MG tablet, Take 1 tablet by mouth 2 (two) times daily., Disp: 60 tablet, Rfl: 0   amphetamine-dextroamphetamine (ADDERALL) 30 MG tablet, Take 1 tablet by mouth 2 (two) times daily., Disp: 60 tablet, Rfl: 0   amphetamine-dextroamphetamine (ADDERALL) 30 MG tablet, Take 1 tablet by mouth 2 (two) times daily., Disp: 60 tablet, Rfl: 0   amphetamine-dextroamphetamine (ADDERALL) 30 MG tablet, Take 1 tablet by mouth 2 (two) times daily., Disp: 60 tablet, Rfl: 0   [START ON 05/10/2023] amphetamine-dextroamphetamine (ADDERALL) 30 MG tablet, Take 1 tablet  by mouth 2 (two) times daily., Disp: 60 tablet, Rfl: 0   busPIRone (BUSPAR) 7.5 MG tablet, TAKE 1 TABLET BY MOUTH 2 TIMES DAILY., Disp: 180 tablet, Rfl: 2   famotidine (PEPCID) 20 MG tablet, Take 20 mg by mouth 2 (two) times daily., Disp: , Rfl:    fluticasone (FLONASE) 50 MCG/ACT nasal spray, Place 1 spray into both nostrils in the morning and at bedtime. Dx:J30.2, Disp: 48 mL, Rfl: 1   phentermine (ADIPEX-P) 37.5 MG tablet, Take 1 tablet (37.5 mg total) by mouth daily before breakfast., Disp: 30 tablet, Rfl: 1   Review of Systems Review of Systems  Constitutional:  Negative for chills and fever.  Eyes:  Negative for blurred vision and double vision.  Respiratory:  Negative for shortness of breath.   Cardiovascular:  Negative for chest pain and palpitations.  Gastrointestinal:  Negative for abdominal pain, constipation, diarrhea, nausea and vomiting.  Neurological:  Negative for dizziness, weakness and headaches.       Objective:    Vitals BP 114/72   Pulse 74   Temp 98.6 F (37 C)   Resp 18   Ht 5\' 4"  (1.626 m)   Wt 170 lb 6.4 oz (77.3 kg)   SpO2 99%   BMI 29.25 kg/m    Physical Examination Physical Exam Constitutional:      Appearance: Normal appearance. She is not ill-appearing.  HENT:     Right Ear: Tympanic membrane, ear canal and external ear normal.     Left Ear: Tympanic membrane, ear canal and external ear normal.     Nose: Nose normal. No congestion or rhinorrhea.     Mouth/Throat:     Mouth: Mucous membranes are moist.     Pharynx: Oropharynx is clear. No oropharyngeal exudate or posterior oropharyngeal erythema.  Cardiovascular:     Rate and Rhythm: Normal rate and regular rhythm.     Pulses: Normal pulses.     Heart sounds: No murmur heard.    No friction rub. No gallop.  Pulmonary:     Effort: Pulmonary effort is normal. No respiratory distress.     Breath sounds: No wheezing, rhonchi or rales.  Abdominal:     General: Bowel sounds are normal. There  is no distension.     Palpations: Abdomen is soft.     Tenderness: There is no abdominal tenderness.  Musculoskeletal:     Right lower leg: No edema.     Left lower leg: No edema.  Skin:    General: Skin is warm and dry.     Findings: No rash.  Neurological:     Mental Status: She is alert.        Assessment & Plan:   Seasonal allergies I had a discussion with her about her allergies and allergy testing/allergy shots.  She will decide on this.  Attention deficit disorder We will refill her adderall  at this time for 3 months.  GAD (generalized anxiety disorder) WE had a discussion about her having problems with OCD.  Her anxiety seems to be controlled.  She wants to hold off on meds for now and I will give her xanax as needed.    Return in about 3 months (around 08/03/2023).   Crist Fat, MD

## 2023-05-06 NOTE — Assessment & Plan Note (Signed)
We will refill her adderall at this time for 3 months.

## 2023-05-06 NOTE — Assessment & Plan Note (Signed)
WE had a discussion about her having problems with OCD.  Her anxiety seems to be controlled.  She wants to hold off on meds for now and I will give her xanax as needed.

## 2023-05-06 NOTE — Assessment & Plan Note (Signed)
I had a discussion with her about her allergies and allergy testing/allergy shots.  She will decide on this.

## 2023-05-12 ENCOUNTER — Other Ambulatory Visit: Payer: Self-pay | Admitting: Internal Medicine

## 2023-05-12 DIAGNOSIS — Z6829 Body mass index (BMI) 29.0-29.9, adult: Secondary | ICD-10-CM

## 2023-05-12 DIAGNOSIS — Z0000 Encounter for general adult medical examination without abnormal findings: Secondary | ICD-10-CM

## 2023-05-15 DIAGNOSIS — L2389 Allergic contact dermatitis due to other agents: Secondary | ICD-10-CM | POA: Diagnosis not present

## 2023-05-15 DIAGNOSIS — J3089 Other allergic rhinitis: Secondary | ICD-10-CM | POA: Diagnosis not present

## 2023-05-15 DIAGNOSIS — Z6828 Body mass index (BMI) 28.0-28.9, adult: Secondary | ICD-10-CM | POA: Diagnosis not present

## 2023-05-25 ENCOUNTER — Other Ambulatory Visit: Payer: Self-pay | Admitting: Internal Medicine

## 2023-05-25 MED ORDER — PHENTERMINE HCL 37.5 MG PO TABS: 37.5000 mg | ORAL_TABLET | Freq: Every day | ORAL | 1 refills | Status: DC

## 2023-07-13 DIAGNOSIS — Z681 Body mass index (BMI) 19 or less, adult: Secondary | ICD-10-CM | POA: Diagnosis not present

## 2023-07-13 DIAGNOSIS — R051 Acute cough: Secondary | ICD-10-CM | POA: Diagnosis not present

## 2023-07-13 DIAGNOSIS — J0140 Acute pansinusitis, unspecified: Secondary | ICD-10-CM | POA: Diagnosis not present

## 2023-07-31 ENCOUNTER — Telehealth: Payer: 59 | Admitting: Internal Medicine

## 2023-07-31 ENCOUNTER — Encounter: Payer: Self-pay | Admitting: Internal Medicine

## 2023-07-31 DIAGNOSIS — F988 Other specified behavioral and emotional disorders with onset usually occurring in childhood and adolescence: Secondary | ICD-10-CM | POA: Diagnosis not present

## 2023-07-31 DIAGNOSIS — F411 Generalized anxiety disorder: Secondary | ICD-10-CM

## 2023-07-31 MED ORDER — AMPHETAMINE-DEXTROAMPHETAMINE 30 MG PO TABS: 30.0000 mg | ORAL_TABLET | Freq: Two times a day (BID) | ORAL | 0 refills | Status: DC

## 2023-07-31 NOTE — Assessment & Plan Note (Signed)
 We will continue on adderall 30mg  BID which seems to be controlling her ADD at this time.
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
## 2023-07-31 NOTE — Assessment & Plan Note (Signed)
 This currently doing well.  We will continue on xanax  as needed.

## 2023-09-10 ENCOUNTER — Other Ambulatory Visit: Payer: Self-pay

## 2023-09-14 ENCOUNTER — Other Ambulatory Visit: Payer: Self-pay

## 2023-11-26 ENCOUNTER — Other Ambulatory Visit: Payer: Self-pay | Admitting: Internal Medicine

## 2023-11-26 DIAGNOSIS — Z6829 Body mass index (BMI) 29.0-29.9, adult: Secondary | ICD-10-CM

## 2023-11-26 DIAGNOSIS — Z0000 Encounter for general adult medical examination without abnormal findings: Secondary | ICD-10-CM

## 2023-11-26 MED ORDER — AMPHETAMINE-DEXTROAMPHETAMINE 30 MG PO TABS: 30.0000 mg | ORAL_TABLET | Freq: Two times a day (BID) | ORAL | 0 refills | Status: DC

## 2023-11-27 ENCOUNTER — Telehealth: Admitting: Internal Medicine

## 2023-11-27 ENCOUNTER — Encounter: Payer: Self-pay | Admitting: Internal Medicine

## 2023-11-27 DIAGNOSIS — F988 Other specified behavioral and emotional disorders with onset usually occurring in childhood and adolescence: Secondary | ICD-10-CM

## 2023-11-27 MED ORDER — AMPHETAMINE-DEXTROAMPHETAMINE 30 MG PO TABS: 30.0000 mg | ORAL_TABLET | Freq: Two times a day (BID) | ORAL | 0 refills | Status: DC

## 2023-11-27 NOTE — Progress Notes (Signed)
 Office Visit  Subjective   Patient ID: Monique [name]   DOB: 12-08-1990   Age: 33 y.o.   MRN: [id number]   Chief Complaint No chief complaint on file.    History of Present Illness The patient is a 33 yo female who returns today for  followup of her ADD.  Since her last visit, she has not had any problems.  There has been no change to her her ADD.  She was diagnosed with ADD in 2017 when she was 33 years old.   The patient is currently on Adderall 30mg  BID and she states this is controlling her symptoms.  She denies any side effects from her medications.  She states that this does control her ADD.  She is able to sit down in class and listen and able to sit down and study and remember what she read.  Again, she has never had a formal evaluation for this in the past but she tells me that she has problems with concentrating and inattention.  This began as a child and has occured most of her life.  She does have problems with concentration and impulsivitiy.  She states her concentration is not that great where he cannot sit down and read a book and he is easily distracted.  She will have to reread a chapter 3-4 times in order to finish it and has done this since she was a child  The patient denies any racing thoughts and she proscrastinates alot.  She fails to pay close attention to details for assignments and has difficulty following through to completion.  She does well with organization skills.  She does fidget by doodling.  However, she admits she does talk excessively at times and has difficulty waiting her turn and interrupts people at times.      Past Medical History Past Medical History:  Diagnosis Date   ADHD    Family history of factor V Leiden mutation    pt has not been tested at this time   GAD (generalized anxiety disorder)    GERD (gastroesophageal reflux disease)    Gestational diabetes      Allergies No Known Allergies   Medications  Current Outpatient Medications:     ALPRAZolam  (XANAX ) 0.25 MG tablet, Take 1 tablet (0.25 mg total) by mouth daily as needed for anxiety., Disp: 30 tablet, Rfl: 0   amphetamine -dextroamphetamine  (ADDERALL) 30 MG tablet, Take 1 tablet by mouth 2 (two) times daily., Disp: 60 tablet, Rfl: 0   amphetamine -dextroamphetamine  (ADDERALL) 30 MG tablet, Take 1 tablet by mouth 2 (two) times daily., Disp: 60 tablet, Rfl: 0   amphetamine -dextroamphetamine  (ADDERALL) 30 MG tablet, Take 1 tablet by mouth 2 (two) times daily., Disp: 60 tablet, Rfl: 0   amphetamine -dextroamphetamine  (ADDERALL) 30 MG tablet, Take 1 tablet by mouth 2 (two) times daily., Disp: 60 tablet, Rfl: 0   amphetamine -dextroamphetamine  (ADDERALL) 30 MG tablet, Take 1 tablet by mouth 2 (two) times daily., Disp: 60 tablet, Rfl: 0   busPIRone  (BUSPAR ) 5 MG tablet, TAKE 1 TAB BY MOUTH THREE TIMES A DAY FOR 1 WEEK, IF NO IMPROVEMENT INCREASE TO 10MG  IN AM, 5MG  IN AFTERNOON AND 10MG  IN PM., Disp: 150 tablet, Rfl: 2   busPIRone  (BUSPAR ) 7.5 MG tablet, TAKE 1 TABLET BY MOUTH 2 TIMES DAILY., Disp: 180 tablet, Rfl: 2   famotidine  (PEPCID ) 20 MG tablet, Take 20 mg by mouth 2 (two) times daily., Disp: , Rfl:    fluticasone  (FLONASE ) 50 MCG/ACT nasal spray, Place  1 spray into both nostrils in the morning and at bedtime. Dx:J30.2, Disp: 48 mL, Rfl: 1   phentermine  (ADIPEX-P ) 37.5 MG tablet, Take 1 tablet (37.5 mg total) by mouth daily before breakfast., Disp: 30 tablet, Rfl: 1   Review of Systems Review of Systems  Constitutional:  Negative for chills and fever.  Eyes:  Negative for blurred vision.  Respiratory:  Negative for shortness of breath.   Cardiovascular:  Negative for chest pain and palpitations.  Gastrointestinal:  Negative for abdominal pain, constipation, diarrhea, nausea and vomiting.  Neurological:  Negative for dizziness, weakness and headaches.       Objective:    Vitals There were no vitals taken for this visit.   Physical Examination Physical  Exam Constitutional:      Appearance: Normal appearance. She is not ill-appearing.  Cardiovascular:     Rate and Rhythm: Normal rate and regular rhythm.     Pulses: Normal pulses.     Heart sounds: No murmur heard.    No friction rub. No gallop.  Pulmonary:     Effort: Pulmonary effort is normal. No respiratory distress.     Breath sounds: No wheezing, rhonchi or rales.  Abdominal:     General: Bowel sounds are normal. There is no distension.     Palpations: Abdomen is soft.     Tenderness: There is no abdominal tenderness.  Musculoskeletal:     Right lower leg: No edema.     Left lower leg: No edema.  Skin:    General: Skin is warm and dry.     Findings: No rash.  Neurological:     General: No focal deficit present.     Mental Status: She is alert and oriented to person, place, and time.  Psychiatric:        Mood and Affect: Mood normal.        Behavior: Behavior normal.        Assessment & Plan:   Attention deficit disorder We will continue on adderall 30mg  BID.  We will see her back in 3 months.    Return in about 3 months (around 02/26/2024).   Selinda Fleeta Finger, MD

## 2023-11-27 NOTE — Assessment & Plan Note (Signed)
 We will continue on adderall 30mg  BID.  We will see her back in 3 months.

## 2024-02-25 ENCOUNTER — Telehealth: Admitting: Internal Medicine

## 2024-02-25 DIAGNOSIS — F988 Other specified behavioral and emotional disorders with onset usually occurring in childhood and adolescence: Secondary | ICD-10-CM | POA: Diagnosis not present

## 2024-02-25 MED ORDER — AMPHETAMINE-DEXTROAMPHETAMINE 30 MG PO TABS: 30.0000 mg | ORAL_TABLET | Freq: Two times a day (BID) | ORAL | 0 refills | Status: AC

## 2024-02-25 MED ORDER — ALPRAZOLAM 0.25 MG PO TABS: 0.2500 mg | ORAL_TABLET | Freq: Every day | ORAL | 2 refills | Status: AC | PRN

## 2024-02-25 MED ORDER — PHENTERMINE HCL 37.5 MG PO TABS: 37.5000 mg | ORAL_TABLET | Freq: Every day | ORAL | 1 refills | Status: AC

## 2024-02-25 NOTE — Progress Notes (Signed)
 Office Visit  Subjective   Patient ID: Monique [name]   DOB: 04-Aug-1990   Age: 33 y.o.   MRN: [id number]   Chief Complaint No chief complaint on file.    History of Present Illness The patient is a 33 yo female who returns today for  followup of her ADD.  She was diagnosed with ADD in 2017 when she was 33 years old.   The patient is currently on Adderall 30mg  BID and she states this is controlling her symptoms.  She denies any side effects from her medications.  She states that this does control her ADD.  She is able to sit down in class and listen and able to sit down and study and remember what she read.  Again, she has never had a formal evaluation for this in the past but she tells me that she has problems with concentrating and inattention.  This began as a child and has occured most of her life.  She does have problems with concentration and impulsivitiy.  She states her concentration is not that great where he cannot sit down and read a book and he is easily distracted.  She will have to reread a chapter 3-4 times in order to finish it and has done this since she was a child  The patient denies any racing thoughts and she proscrastinates alot.  She fails to pay close attention to details for assignments and has difficulty following through to completion.  She does well with organization skills.  She does fidget by doodling.  However, she admits she does talk excessively at times and has difficulty waiting her turn and interrupts people at times.  She has a history of anxiety that is stable.  There is no depression.     Past Medical History Past Medical History:  Diagnosis Date   ADHD    Family history of factor V Leiden mutation    pt has not been tested at this time   GAD (generalized anxiety disorder)    GERD (gastroesophageal reflux disease)    Gestational diabetes      Allergies No Known Allergies   Medications  Current Outpatient Medications:    ALPRAZolam  (XANAX ) 0.25  MG tablet, Take 1 tablet (0.25 mg total) by mouth daily as needed for anxiety., Disp: 30 tablet, Rfl: 0   famotidine  (PEPCID ) 20 MG tablet, Take 20 mg by mouth 2 (two) times daily., Disp: , Rfl:    fluticasone  (FLONASE ) 50 MCG/ACT nasal spray, Place 1 spray into both nostrils in the morning and at bedtime. Dx:J30.2, Disp: 48 mL, Rfl: 1   Review of Systems Review of Systems  Eyes:  Negative for blurred vision.  Respiratory:  Negative for shortness of breath.   Cardiovascular:  Negative for chest pain and palpitations.  Gastrointestinal:  Negative for abdominal pain, constipation, diarrhea, nausea and vomiting.  Musculoskeletal:  Negative for myalgias.  Neurological:  Negative for dizziness, weakness and headaches.       Objective:    Vitals There were no vitals taken for this visit.   Physical Examination Physical Exam Constitutional:      Appearance: Normal appearance.  Neurological:     Mental Status: She is alert.  Psychiatric:        Mood and Affect: Mood normal.        Behavior: Behavior normal.        Assessment & Plan:   Attention deficit disorder Her ADD is controlled.  We will refill her meds  today.    No follow-ups on file.   Selinda Fleeta Finger, MD

## 2024-02-25 NOTE — Assessment & Plan Note (Signed)
 Her ADD is controlled.  We will refill her meds today.

## 2024-03-01 ENCOUNTER — Encounter: Admitting: Internal Medicine
